# Patient Record
Sex: Female | Born: 1955 | Race: White | Hispanic: No | Smoking: Current every day smoker
Health system: Southern US, Community
[De-identification: ages and names within clinical notes are randomized; demographics above are authoritative.]

---
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
## 1988-08-11 HISTORY — PX: PARTIAL HYSTERECTOMY: SHX80

## 1988-08-11 HISTORY — PX: ELBOW SURGERY: SHX618

## 2013-08-17 DIAGNOSIS — M25476 Effusion, unspecified foot: Secondary | ICD-10-CM | POA: Diagnosis not present

## 2013-08-17 DIAGNOSIS — M25473 Effusion, unspecified ankle: Secondary | ICD-10-CM | POA: Diagnosis not present

## 2013-08-18 DIAGNOSIS — M25476 Effusion, unspecified foot: Secondary | ICD-10-CM | POA: Diagnosis not present

## 2013-08-18 DIAGNOSIS — M25473 Effusion, unspecified ankle: Secondary | ICD-10-CM | POA: Diagnosis not present

## 2013-08-23 DIAGNOSIS — M25476 Effusion, unspecified foot: Secondary | ICD-10-CM | POA: Diagnosis not present

## 2013-08-23 DIAGNOSIS — M25473 Effusion, unspecified ankle: Secondary | ICD-10-CM | POA: Diagnosis not present

## 2013-08-25 DIAGNOSIS — M25473 Effusion, unspecified ankle: Secondary | ICD-10-CM | POA: Diagnosis not present

## 2013-08-25 DIAGNOSIS — M25476 Effusion, unspecified foot: Secondary | ICD-10-CM | POA: Diagnosis not present

## 2013-08-25 DIAGNOSIS — M25579 Pain in unspecified ankle and joints of unspecified foot: Secondary | ICD-10-CM | POA: Diagnosis not present

## 2013-09-06 DIAGNOSIS — M25473 Effusion, unspecified ankle: Secondary | ICD-10-CM | POA: Diagnosis not present

## 2013-09-08 DIAGNOSIS — M25476 Effusion, unspecified foot: Secondary | ICD-10-CM | POA: Diagnosis not present

## 2013-09-08 DIAGNOSIS — M25473 Effusion, unspecified ankle: Secondary | ICD-10-CM | POA: Diagnosis not present

## 2013-09-14 DIAGNOSIS — M25473 Effusion, unspecified ankle: Secondary | ICD-10-CM | POA: Diagnosis not present

## 2013-09-14 DIAGNOSIS — M25476 Effusion, unspecified foot: Secondary | ICD-10-CM | POA: Diagnosis not present

## 2013-10-26 DIAGNOSIS — J441 Chronic obstructive pulmonary disease with (acute) exacerbation: Secondary | ICD-10-CM | POA: Diagnosis not present

## 2013-10-26 DIAGNOSIS — J449 Chronic obstructive pulmonary disease, unspecified: Secondary | ICD-10-CM | POA: Diagnosis not present

## 2013-10-26 DIAGNOSIS — I10 Essential (primary) hypertension: Secondary | ICD-10-CM | POA: Diagnosis not present

## 2013-10-26 DIAGNOSIS — R7301 Impaired fasting glucose: Secondary | ICD-10-CM | POA: Diagnosis not present

## 2013-10-26 DIAGNOSIS — E785 Hyperlipidemia, unspecified: Secondary | ICD-10-CM | POA: Diagnosis not present

## 2013-11-16 DIAGNOSIS — F320 Major depressive disorder, single episode, mild: Secondary | ICD-10-CM | POA: Diagnosis not present

## 2013-11-30 DIAGNOSIS — T6391XA Toxic effect of contact with unspecified venomous animal, accidental (unintentional), initial encounter: Secondary | ICD-10-CM | POA: Diagnosis not present

## 2013-11-30 DIAGNOSIS — K219 Gastro-esophageal reflux disease without esophagitis: Secondary | ICD-10-CM | POA: Diagnosis not present

## 2013-11-30 DIAGNOSIS — S90569A Insect bite (nonvenomous), unspecified ankle, initial encounter: Secondary | ICD-10-CM | POA: Diagnosis not present

## 2013-11-30 DIAGNOSIS — W57XXXA Bitten or stung by nonvenomous insect and other nonvenomous arthropods, initial encounter: Secondary | ICD-10-CM | POA: Diagnosis not present

## 2013-11-30 DIAGNOSIS — IMO0001 Reserved for inherently not codable concepts without codable children: Secondary | ICD-10-CM | POA: Diagnosis not present

## 2013-11-30 DIAGNOSIS — I10 Essential (primary) hypertension: Secondary | ICD-10-CM | POA: Diagnosis not present

## 2014-02-08 DIAGNOSIS — F339 Major depressive disorder, recurrent, unspecified: Secondary | ICD-10-CM | POA: Diagnosis not present

## 2014-02-22 DIAGNOSIS — M542 Cervicalgia: Secondary | ICD-10-CM | POA: Diagnosis not present

## 2014-02-22 DIAGNOSIS — J449 Chronic obstructive pulmonary disease, unspecified: Secondary | ICD-10-CM | POA: Diagnosis not present

## 2014-02-22 DIAGNOSIS — A672 Late lesions of pinta: Secondary | ICD-10-CM | POA: Diagnosis not present

## 2014-02-22 DIAGNOSIS — I10 Essential (primary) hypertension: Secondary | ICD-10-CM | POA: Diagnosis not present

## 2014-02-22 DIAGNOSIS — E119 Type 2 diabetes mellitus without complications: Secondary | ICD-10-CM | POA: Diagnosis not present

## 2014-02-22 DIAGNOSIS — E785 Hyperlipidemia, unspecified: Secondary | ICD-10-CM | POA: Diagnosis not present

## 2014-02-27 DIAGNOSIS — M5030 Other cervical disc degeneration, unspecified cervical region: Secondary | ICD-10-CM | POA: Diagnosis not present

## 2014-02-27 DIAGNOSIS — J449 Chronic obstructive pulmonary disease, unspecified: Secondary | ICD-10-CM | POA: Diagnosis not present

## 2014-03-14 DIAGNOSIS — L820 Inflamed seborrheic keratosis: Secondary | ICD-10-CM | POA: Diagnosis not present

## 2014-05-24 DIAGNOSIS — J449 Chronic obstructive pulmonary disease, unspecified: Secondary | ICD-10-CM | POA: Diagnosis not present

## 2014-05-24 DIAGNOSIS — E785 Hyperlipidemia, unspecified: Secondary | ICD-10-CM | POA: Diagnosis not present

## 2014-05-24 DIAGNOSIS — J069 Acute upper respiratory infection, unspecified: Secondary | ICD-10-CM | POA: Diagnosis not present

## 2014-05-24 DIAGNOSIS — I10 Essential (primary) hypertension: Secondary | ICD-10-CM | POA: Diagnosis not present

## 2014-05-24 DIAGNOSIS — E119 Type 2 diabetes mellitus without complications: Secondary | ICD-10-CM | POA: Diagnosis not present

## 2014-06-02 DIAGNOSIS — F329 Major depressive disorder, single episode, unspecified: Secondary | ICD-10-CM | POA: Diagnosis not present

## 2014-08-18 DIAGNOSIS — Z0000 Encounter for general adult medical examination without abnormal findings: Secondary | ICD-10-CM | POA: Diagnosis not present

## 2014-08-18 DIAGNOSIS — R801 Persistent proteinuria, unspecified: Secondary | ICD-10-CM | POA: Diagnosis not present

## 2014-08-18 DIAGNOSIS — E119 Type 2 diabetes mellitus without complications: Secondary | ICD-10-CM | POA: Diagnosis not present

## 2014-08-18 DIAGNOSIS — I10 Essential (primary) hypertension: Secondary | ICD-10-CM | POA: Diagnosis not present

## 2014-08-18 DIAGNOSIS — E785 Hyperlipidemia, unspecified: Secondary | ICD-10-CM | POA: Diagnosis not present

## 2014-08-25 DIAGNOSIS — F331 Major depressive disorder, recurrent, moderate: Secondary | ICD-10-CM | POA: Diagnosis not present

## 2014-08-25 DIAGNOSIS — Z1231 Encounter for screening mammogram for malignant neoplasm of breast: Secondary | ICD-10-CM | POA: Diagnosis not present

## 2014-09-17 DIAGNOSIS — I10 Essential (primary) hypertension: Secondary | ICD-10-CM | POA: Diagnosis not present

## 2014-09-17 DIAGNOSIS — M25551 Pain in right hip: Secondary | ICD-10-CM | POA: Diagnosis not present

## 2014-09-17 DIAGNOSIS — E780 Pure hypercholesterolemia: Secondary | ICD-10-CM | POA: Diagnosis not present

## 2014-09-27 DIAGNOSIS — I10 Essential (primary) hypertension: Secondary | ICD-10-CM | POA: Diagnosis not present

## 2014-09-27 DIAGNOSIS — J309 Allergic rhinitis, unspecified: Secondary | ICD-10-CM | POA: Diagnosis not present

## 2014-09-27 DIAGNOSIS — M25551 Pain in right hip: Secondary | ICD-10-CM | POA: Diagnosis not present

## 2014-09-27 DIAGNOSIS — J449 Chronic obstructive pulmonary disease, unspecified: Secondary | ICD-10-CM | POA: Diagnosis not present

## 2014-10-04 DIAGNOSIS — L820 Inflamed seborrheic keratosis: Secondary | ICD-10-CM | POA: Diagnosis not present

## 2014-10-12 DIAGNOSIS — N390 Urinary tract infection, site not specified: Secondary | ICD-10-CM | POA: Diagnosis not present

## 2014-10-12 DIAGNOSIS — R32 Unspecified urinary incontinence: Secondary | ICD-10-CM | POA: Diagnosis not present

## 2014-10-23 DIAGNOSIS — J449 Chronic obstructive pulmonary disease, unspecified: Secondary | ICD-10-CM | POA: Diagnosis not present

## 2014-10-23 DIAGNOSIS — R079 Chest pain, unspecified: Secondary | ICD-10-CM | POA: Diagnosis not present

## 2014-10-26 DIAGNOSIS — R000 Tachycardia, unspecified: Secondary | ICD-10-CM | POA: Diagnosis not present

## 2014-10-26 DIAGNOSIS — I249 Acute ischemic heart disease, unspecified: Secondary | ICD-10-CM | POA: Diagnosis not present

## 2014-10-26 DIAGNOSIS — Z881 Allergy status to other antibiotic agents status: Secondary | ICD-10-CM | POA: Diagnosis not present

## 2014-10-26 DIAGNOSIS — M1990 Unspecified osteoarthritis, unspecified site: Secondary | ICD-10-CM | POA: Diagnosis not present

## 2014-10-26 DIAGNOSIS — Z8489 Family history of other specified conditions: Secondary | ICD-10-CM | POA: Diagnosis not present

## 2014-10-26 DIAGNOSIS — K219 Gastro-esophageal reflux disease without esophagitis: Secondary | ICD-10-CM | POA: Diagnosis not present

## 2014-10-26 DIAGNOSIS — J449 Chronic obstructive pulmonary disease, unspecified: Secondary | ICD-10-CM | POA: Diagnosis not present

## 2014-10-26 DIAGNOSIS — R0789 Other chest pain: Secondary | ICD-10-CM | POA: Diagnosis not present

## 2014-10-26 DIAGNOSIS — E780 Pure hypercholesterolemia: Secondary | ICD-10-CM | POA: Diagnosis not present

## 2014-10-26 DIAGNOSIS — Z9849 Cataract extraction status, unspecified eye: Secondary | ICD-10-CM | POA: Diagnosis not present

## 2014-10-26 DIAGNOSIS — J45909 Unspecified asthma, uncomplicated: Secondary | ICD-10-CM | POA: Diagnosis not present

## 2014-10-26 DIAGNOSIS — Z90710 Acquired absence of both cervix and uterus: Secondary | ICD-10-CM | POA: Diagnosis not present

## 2014-10-26 DIAGNOSIS — Z87440 Personal history of urinary (tract) infections: Secondary | ICD-10-CM | POA: Diagnosis not present

## 2014-10-26 DIAGNOSIS — E784 Other hyperlipidemia: Secondary | ICD-10-CM | POA: Diagnosis not present

## 2014-10-26 DIAGNOSIS — Z888 Allergy status to other drugs, medicaments and biological substances status: Secondary | ICD-10-CM | POA: Diagnosis not present

## 2014-10-26 DIAGNOSIS — R072 Precordial pain: Secondary | ICD-10-CM | POA: Diagnosis not present

## 2014-10-26 DIAGNOSIS — Z8249 Family history of ischemic heart disease and other diseases of the circulatory system: Secondary | ICD-10-CM | POA: Diagnosis not present

## 2014-10-26 DIAGNOSIS — Z882 Allergy status to sulfonamides status: Secondary | ICD-10-CM | POA: Diagnosis not present

## 2014-10-26 DIAGNOSIS — I10 Essential (primary) hypertension: Secondary | ICD-10-CM | POA: Diagnosis not present

## 2014-10-26 DIAGNOSIS — F418 Other specified anxiety disorders: Secondary | ICD-10-CM | POA: Diagnosis not present

## 2014-10-26 DIAGNOSIS — R079 Chest pain, unspecified: Secondary | ICD-10-CM | POA: Diagnosis not present

## 2014-10-26 DIAGNOSIS — R0602 Shortness of breath: Secondary | ICD-10-CM | POA: Diagnosis not present

## 2014-10-26 DIAGNOSIS — Z9049 Acquired absence of other specified parts of digestive tract: Secondary | ICD-10-CM | POA: Diagnosis not present

## 2014-10-26 DIAGNOSIS — Z9889 Other specified postprocedural states: Secondary | ICD-10-CM | POA: Diagnosis not present

## 2014-10-26 DIAGNOSIS — Z886 Allergy status to analgesic agent status: Secondary | ICD-10-CM | POA: Diagnosis not present

## 2014-10-26 DIAGNOSIS — Z9981 Dependence on supplemental oxygen: Secondary | ICD-10-CM | POA: Diagnosis not present

## 2014-10-26 DIAGNOSIS — F17210 Nicotine dependence, cigarettes, uncomplicated: Secondary | ICD-10-CM | POA: Diagnosis not present

## 2014-10-26 DIAGNOSIS — Z720 Tobacco use: Secondary | ICD-10-CM | POA: Diagnosis not present

## 2014-10-26 DIAGNOSIS — E785 Hyperlipidemia, unspecified: Secondary | ICD-10-CM | POA: Diagnosis not present

## 2014-10-26 DIAGNOSIS — R0682 Tachypnea, not elsewhere classified: Secondary | ICD-10-CM | POA: Diagnosis not present

## 2014-10-27 DIAGNOSIS — K219 Gastro-esophageal reflux disease without esophagitis: Secondary | ICD-10-CM | POA: Diagnosis not present

## 2014-10-27 DIAGNOSIS — E784 Other hyperlipidemia: Secondary | ICD-10-CM | POA: Diagnosis not present

## 2014-10-27 DIAGNOSIS — R079 Chest pain, unspecified: Secondary | ICD-10-CM | POA: Diagnosis not present

## 2014-10-27 DIAGNOSIS — R072 Precordial pain: Secondary | ICD-10-CM | POA: Diagnosis not present

## 2014-10-27 DIAGNOSIS — I10 Essential (primary) hypertension: Secondary | ICD-10-CM | POA: Diagnosis not present

## 2014-11-07 DIAGNOSIS — E785 Hyperlipidemia, unspecified: Secondary | ICD-10-CM | POA: Diagnosis not present

## 2014-11-07 DIAGNOSIS — J449 Chronic obstructive pulmonary disease, unspecified: Secondary | ICD-10-CM | POA: Diagnosis not present

## 2014-11-07 DIAGNOSIS — F29 Unspecified psychosis not due to a substance or known physiological condition: Secondary | ICD-10-CM | POA: Diagnosis not present

## 2014-11-07 DIAGNOSIS — F411 Generalized anxiety disorder: Secondary | ICD-10-CM | POA: Diagnosis not present

## 2014-11-07 DIAGNOSIS — I10 Essential (primary) hypertension: Secondary | ICD-10-CM | POA: Diagnosis not present

## 2014-11-07 DIAGNOSIS — Z6834 Body mass index (BMI) 34.0-34.9, adult: Secondary | ICD-10-CM | POA: Diagnosis not present

## 2014-11-07 DIAGNOSIS — K219 Gastro-esophageal reflux disease without esophagitis: Secondary | ICD-10-CM | POA: Diagnosis not present

## 2014-11-17 DIAGNOSIS — F331 Major depressive disorder, recurrent, moderate: Secondary | ICD-10-CM | POA: Diagnosis not present

## 2014-12-08 DIAGNOSIS — Z79899 Other long term (current) drug therapy: Secondary | ICD-10-CM | POA: Diagnosis not present

## 2014-12-08 DIAGNOSIS — J449 Chronic obstructive pulmonary disease, unspecified: Secondary | ICD-10-CM | POA: Diagnosis not present

## 2014-12-08 DIAGNOSIS — E785 Hyperlipidemia, unspecified: Secondary | ICD-10-CM | POA: Diagnosis not present

## 2014-12-08 DIAGNOSIS — I10 Essential (primary) hypertension: Secondary | ICD-10-CM | POA: Diagnosis not present

## 2014-12-08 DIAGNOSIS — F411 Generalized anxiety disorder: Secondary | ICD-10-CM | POA: Diagnosis not present

## 2014-12-08 DIAGNOSIS — Z6835 Body mass index (BMI) 35.0-35.9, adult: Secondary | ICD-10-CM | POA: Diagnosis not present

## 2014-12-08 DIAGNOSIS — K219 Gastro-esophageal reflux disease without esophagitis: Secondary | ICD-10-CM | POA: Diagnosis not present

## 2015-01-24 DIAGNOSIS — F331 Major depressive disorder, recurrent, moderate: Secondary | ICD-10-CM | POA: Diagnosis not present

## 2015-04-10 DIAGNOSIS — Z79899 Other long term (current) drug therapy: Secondary | ICD-10-CM | POA: Diagnosis not present

## 2015-04-10 DIAGNOSIS — J449 Chronic obstructive pulmonary disease, unspecified: Secondary | ICD-10-CM | POA: Diagnosis not present

## 2015-04-10 DIAGNOSIS — E785 Hyperlipidemia, unspecified: Secondary | ICD-10-CM | POA: Diagnosis not present

## 2015-04-10 DIAGNOSIS — Z6835 Body mass index (BMI) 35.0-35.9, adult: Secondary | ICD-10-CM | POA: Diagnosis not present

## 2015-04-10 DIAGNOSIS — I10 Essential (primary) hypertension: Secondary | ICD-10-CM | POA: Diagnosis not present

## 2015-04-10 DIAGNOSIS — F411 Generalized anxiety disorder: Secondary | ICD-10-CM | POA: Diagnosis not present

## 2015-04-10 DIAGNOSIS — K219 Gastro-esophageal reflux disease without esophagitis: Secondary | ICD-10-CM | POA: Diagnosis not present

## 2015-04-10 DIAGNOSIS — K5900 Constipation, unspecified: Secondary | ICD-10-CM | POA: Diagnosis not present

## 2015-05-18 DIAGNOSIS — F331 Major depressive disorder, recurrent, moderate: Secondary | ICD-10-CM | POA: Diagnosis not present

## 2015-08-10 DIAGNOSIS — Z6834 Body mass index (BMI) 34.0-34.9, adult: Secondary | ICD-10-CM | POA: Diagnosis not present

## 2015-08-10 DIAGNOSIS — K219 Gastro-esophageal reflux disease without esophagitis: Secondary | ICD-10-CM | POA: Diagnosis not present

## 2015-08-10 DIAGNOSIS — Z79899 Other long term (current) drug therapy: Secondary | ICD-10-CM | POA: Diagnosis not present

## 2015-08-10 DIAGNOSIS — I10 Essential (primary) hypertension: Secondary | ICD-10-CM | POA: Diagnosis not present

## 2015-08-10 DIAGNOSIS — Z1231 Encounter for screening mammogram for malignant neoplasm of breast: Secondary | ICD-10-CM | POA: Diagnosis not present

## 2015-08-10 DIAGNOSIS — J449 Chronic obstructive pulmonary disease, unspecified: Secondary | ICD-10-CM | POA: Diagnosis not present

## 2015-08-10 DIAGNOSIS — E785 Hyperlipidemia, unspecified: Secondary | ICD-10-CM | POA: Diagnosis not present

## 2015-08-10 DIAGNOSIS — Z720 Tobacco use: Secondary | ICD-10-CM | POA: Diagnosis not present

## 2015-08-27 DIAGNOSIS — Z1231 Encounter for screening mammogram for malignant neoplasm of breast: Secondary | ICD-10-CM | POA: Diagnosis not present

## 2015-08-30 DIAGNOSIS — F331 Major depressive disorder, recurrent, moderate: Secondary | ICD-10-CM | POA: Diagnosis not present

## 2015-09-03 DIAGNOSIS — Z0000 Encounter for general adult medical examination without abnormal findings: Secondary | ICD-10-CM | POA: Diagnosis not present

## 2015-09-03 DIAGNOSIS — E1165 Type 2 diabetes mellitus with hyperglycemia: Secondary | ICD-10-CM | POA: Diagnosis not present

## 2015-09-03 DIAGNOSIS — Z6834 Body mass index (BMI) 34.0-34.9, adult: Secondary | ICD-10-CM | POA: Diagnosis not present

## 2015-09-03 DIAGNOSIS — Z23 Encounter for immunization: Secondary | ICD-10-CM | POA: Diagnosis not present

## 2015-09-03 DIAGNOSIS — J449 Chronic obstructive pulmonary disease, unspecified: Secondary | ICD-10-CM | POA: Diagnosis not present

## 2015-09-03 DIAGNOSIS — E669 Obesity, unspecified: Secondary | ICD-10-CM | POA: Diagnosis not present

## 2015-10-02 DIAGNOSIS — Z79899 Other long term (current) drug therapy: Secondary | ICD-10-CM | POA: Diagnosis not present

## 2015-10-02 DIAGNOSIS — Z6834 Body mass index (BMI) 34.0-34.9, adult: Secondary | ICD-10-CM | POA: Diagnosis not present

## 2015-10-02 DIAGNOSIS — J309 Allergic rhinitis, unspecified: Secondary | ICD-10-CM | POA: Diagnosis not present

## 2015-10-11 DIAGNOSIS — F331 Major depressive disorder, recurrent, moderate: Secondary | ICD-10-CM | POA: Diagnosis not present

## 2015-10-25 DIAGNOSIS — J069 Acute upper respiratory infection, unspecified: Secondary | ICD-10-CM | POA: Diagnosis not present

## 2015-10-25 DIAGNOSIS — K649 Unspecified hemorrhoids: Secondary | ICD-10-CM | POA: Diagnosis not present

## 2015-10-25 DIAGNOSIS — R6889 Other general symptoms and signs: Secondary | ICD-10-CM | POA: Diagnosis not present

## 2015-10-25 DIAGNOSIS — J449 Chronic obstructive pulmonary disease, unspecified: Secondary | ICD-10-CM | POA: Diagnosis not present

## 2015-10-25 DIAGNOSIS — Z6834 Body mass index (BMI) 34.0-34.9, adult: Secondary | ICD-10-CM | POA: Diagnosis not present

## 2015-11-15 DIAGNOSIS — F331 Major depressive disorder, recurrent, moderate: Secondary | ICD-10-CM | POA: Diagnosis not present

## 2015-11-20 DIAGNOSIS — S99922A Unspecified injury of left foot, initial encounter: Secondary | ICD-10-CM | POA: Diagnosis not present

## 2015-11-20 DIAGNOSIS — S93602A Unspecified sprain of left foot, initial encounter: Secondary | ICD-10-CM | POA: Diagnosis not present

## 2015-11-20 DIAGNOSIS — S99912A Unspecified injury of left ankle, initial encounter: Secondary | ICD-10-CM | POA: Diagnosis not present

## 2015-11-20 DIAGNOSIS — M79672 Pain in left foot: Secondary | ICD-10-CM | POA: Diagnosis not present

## 2015-11-20 DIAGNOSIS — M25572 Pain in left ankle and joints of left foot: Secondary | ICD-10-CM | POA: Diagnosis not present

## 2015-11-29 DIAGNOSIS — S99912A Unspecified injury of left ankle, initial encounter: Secondary | ICD-10-CM | POA: Diagnosis not present

## 2015-12-07 DIAGNOSIS — E1165 Type 2 diabetes mellitus with hyperglycemia: Secondary | ICD-10-CM | POA: Diagnosis not present

## 2015-12-07 DIAGNOSIS — E785 Hyperlipidemia, unspecified: Secondary | ICD-10-CM | POA: Diagnosis not present

## 2015-12-07 DIAGNOSIS — J449 Chronic obstructive pulmonary disease, unspecified: Secondary | ICD-10-CM | POA: Diagnosis not present

## 2015-12-07 DIAGNOSIS — I10 Essential (primary) hypertension: Secondary | ICD-10-CM | POA: Diagnosis not present

## 2015-12-07 DIAGNOSIS — Z79899 Other long term (current) drug therapy: Secondary | ICD-10-CM | POA: Diagnosis not present

## 2015-12-07 DIAGNOSIS — Z6834 Body mass index (BMI) 34.0-34.9, adult: Secondary | ICD-10-CM | POA: Diagnosis not present

## 2015-12-07 DIAGNOSIS — M545 Low back pain: Secondary | ICD-10-CM | POA: Diagnosis not present

## 2015-12-07 DIAGNOSIS — Z1389 Encounter for screening for other disorder: Secondary | ICD-10-CM | POA: Diagnosis not present

## 2016-01-31 DIAGNOSIS — F331 Major depressive disorder, recurrent, moderate: Secondary | ICD-10-CM | POA: Diagnosis not present

## 2016-03-11 DIAGNOSIS — I10 Essential (primary) hypertension: Secondary | ICD-10-CM | POA: Diagnosis not present

## 2016-03-11 DIAGNOSIS — J449 Chronic obstructive pulmonary disease, unspecified: Secondary | ICD-10-CM | POA: Diagnosis not present

## 2016-03-11 DIAGNOSIS — Z79899 Other long term (current) drug therapy: Secondary | ICD-10-CM | POA: Diagnosis not present

## 2016-03-11 DIAGNOSIS — E1165 Type 2 diabetes mellitus with hyperglycemia: Secondary | ICD-10-CM | POA: Diagnosis not present

## 2016-03-11 DIAGNOSIS — E785 Hyperlipidemia, unspecified: Secondary | ICD-10-CM | POA: Diagnosis not present

## 2016-03-11 DIAGNOSIS — Z1389 Encounter for screening for other disorder: Secondary | ICD-10-CM | POA: Diagnosis not present

## 2016-03-11 DIAGNOSIS — Z6834 Body mass index (BMI) 34.0-34.9, adult: Secondary | ICD-10-CM | POA: Diagnosis not present

## 2016-03-27 DIAGNOSIS — F331 Major depressive disorder, recurrent, moderate: Secondary | ICD-10-CM | POA: Diagnosis not present

## 2016-04-23 DIAGNOSIS — J342 Deviated nasal septum: Secondary | ICD-10-CM | POA: Diagnosis not present

## 2016-04-23 DIAGNOSIS — J3489 Other specified disorders of nose and nasal sinuses: Secondary | ICD-10-CM | POA: Diagnosis not present

## 2016-04-23 DIAGNOSIS — J343 Hypertrophy of nasal turbinates: Secondary | ICD-10-CM | POA: Diagnosis not present

## 2016-04-23 DIAGNOSIS — J449 Chronic obstructive pulmonary disease, unspecified: Secondary | ICD-10-CM | POA: Diagnosis not present

## 2016-05-12 DIAGNOSIS — E669 Obesity, unspecified: Secondary | ICD-10-CM | POA: Diagnosis not present

## 2016-05-12 DIAGNOSIS — Z79899 Other long term (current) drug therapy: Secondary | ICD-10-CM | POA: Diagnosis not present

## 2016-05-12 DIAGNOSIS — Z720 Tobacco use: Secondary | ICD-10-CM | POA: Diagnosis not present

## 2016-05-12 DIAGNOSIS — J441 Chronic obstructive pulmonary disease with (acute) exacerbation: Secondary | ICD-10-CM | POA: Diagnosis not present

## 2016-05-12 DIAGNOSIS — J410 Simple chronic bronchitis: Secondary | ICD-10-CM | POA: Diagnosis not present

## 2016-05-12 DIAGNOSIS — Z6835 Body mass index (BMI) 35.0-35.9, adult: Secondary | ICD-10-CM | POA: Diagnosis not present

## 2016-05-12 DIAGNOSIS — F17200 Nicotine dependence, unspecified, uncomplicated: Secondary | ICD-10-CM | POA: Diagnosis not present

## 2016-06-03 DIAGNOSIS — Z87891 Personal history of nicotine dependence: Secondary | ICD-10-CM | POA: Diagnosis not present

## 2016-06-03 DIAGNOSIS — J449 Chronic obstructive pulmonary disease, unspecified: Secondary | ICD-10-CM | POA: Diagnosis not present

## 2016-06-03 DIAGNOSIS — R0602 Shortness of breath: Secondary | ICD-10-CM | POA: Diagnosis not present

## 2016-06-03 DIAGNOSIS — Z13820 Encounter for screening for osteoporosis: Secondary | ICD-10-CM | POA: Diagnosis not present

## 2016-06-03 DIAGNOSIS — Z7951 Long term (current) use of inhaled steroids: Secondary | ICD-10-CM | POA: Diagnosis not present

## 2016-06-05 DIAGNOSIS — Z122 Encounter for screening for malignant neoplasm of respiratory organs: Secondary | ICD-10-CM | POA: Diagnosis not present

## 2016-06-05 DIAGNOSIS — Z87891 Personal history of nicotine dependence: Secondary | ICD-10-CM | POA: Diagnosis not present

## 2016-06-12 DIAGNOSIS — F17200 Nicotine dependence, unspecified, uncomplicated: Secondary | ICD-10-CM | POA: Diagnosis not present

## 2016-06-12 DIAGNOSIS — Z87891 Personal history of nicotine dependence: Secondary | ICD-10-CM | POA: Diagnosis not present

## 2016-06-12 DIAGNOSIS — J9801 Acute bronchospasm: Secondary | ICD-10-CM | POA: Diagnosis not present

## 2016-06-12 DIAGNOSIS — Z23 Encounter for immunization: Secondary | ICD-10-CM | POA: Diagnosis not present

## 2016-06-12 DIAGNOSIS — J449 Chronic obstructive pulmonary disease, unspecified: Secondary | ICD-10-CM | POA: Diagnosis not present

## 2016-06-25 DIAGNOSIS — F4310 Post-traumatic stress disorder, unspecified: Secondary | ICD-10-CM | POA: Diagnosis not present

## 2016-07-01 DIAGNOSIS — J449 Chronic obstructive pulmonary disease, unspecified: Secondary | ICD-10-CM | POA: Diagnosis not present

## 2016-07-14 DIAGNOSIS — Z79899 Other long term (current) drug therapy: Secondary | ICD-10-CM | POA: Diagnosis not present

## 2016-07-14 DIAGNOSIS — Z6836 Body mass index (BMI) 36.0-36.9, adult: Secondary | ICD-10-CM | POA: Diagnosis not present

## 2016-07-14 DIAGNOSIS — I10 Essential (primary) hypertension: Secondary | ICD-10-CM | POA: Diagnosis not present

## 2016-07-14 DIAGNOSIS — E785 Hyperlipidemia, unspecified: Secondary | ICD-10-CM | POA: Diagnosis not present

## 2016-07-14 DIAGNOSIS — J441 Chronic obstructive pulmonary disease with (acute) exacerbation: Secondary | ICD-10-CM | POA: Diagnosis not present

## 2016-07-14 DIAGNOSIS — E1165 Type 2 diabetes mellitus with hyperglycemia: Secondary | ICD-10-CM | POA: Diagnosis not present

## 2016-07-28 DIAGNOSIS — D72829 Elevated white blood cell count, unspecified: Secondary | ICD-10-CM | POA: Diagnosis not present

## 2016-09-01 DIAGNOSIS — K648 Other hemorrhoids: Secondary | ICD-10-CM | POA: Diagnosis not present

## 2016-09-01 DIAGNOSIS — Z6838 Body mass index (BMI) 38.0-38.9, adult: Secondary | ICD-10-CM | POA: Diagnosis not present

## 2016-10-14 DIAGNOSIS — Z1231 Encounter for screening mammogram for malignant neoplasm of breast: Secondary | ICD-10-CM | POA: Diagnosis not present

## 2016-10-15 DIAGNOSIS — F4310 Post-traumatic stress disorder, unspecified: Secondary | ICD-10-CM

## 2016-10-15 DIAGNOSIS — F331 Major depressive disorder, recurrent, moderate: Secondary | ICD-10-CM | POA: Diagnosis not present

## 2016-10-15 HISTORY — DX: Post-traumatic stress disorder, unspecified: F43.10

## 2016-11-12 DIAGNOSIS — E785 Hyperlipidemia, unspecified: Secondary | ICD-10-CM | POA: Diagnosis not present

## 2016-11-12 DIAGNOSIS — J441 Chronic obstructive pulmonary disease with (acute) exacerbation: Secondary | ICD-10-CM | POA: Diagnosis not present

## 2016-11-12 DIAGNOSIS — Z6838 Body mass index (BMI) 38.0-38.9, adult: Secondary | ICD-10-CM | POA: Diagnosis not present

## 2016-11-12 DIAGNOSIS — Z79899 Other long term (current) drug therapy: Secondary | ICD-10-CM | POA: Diagnosis not present

## 2016-11-12 DIAGNOSIS — I10 Essential (primary) hypertension: Secondary | ICD-10-CM | POA: Diagnosis not present

## 2016-11-12 DIAGNOSIS — E669 Obesity, unspecified: Secondary | ICD-10-CM | POA: Diagnosis not present

## 2016-11-12 DIAGNOSIS — E1165 Type 2 diabetes mellitus with hyperglycemia: Secondary | ICD-10-CM | POA: Diagnosis not present

## 2016-11-13 DIAGNOSIS — M25512 Pain in left shoulder: Secondary | ICD-10-CM | POA: Diagnosis not present

## 2016-11-13 DIAGNOSIS — S79912A Unspecified injury of left hip, initial encounter: Secondary | ICD-10-CM | POA: Diagnosis not present

## 2016-11-13 DIAGNOSIS — S7002XA Contusion of left hip, initial encounter: Secondary | ICD-10-CM | POA: Diagnosis not present

## 2016-11-13 DIAGNOSIS — S50812A Abrasion of left forearm, initial encounter: Secondary | ICD-10-CM | POA: Diagnosis not present

## 2016-11-13 DIAGNOSIS — S46912A Strain of unspecified muscle, fascia and tendon at shoulder and upper arm level, left arm, initial encounter: Secondary | ICD-10-CM | POA: Diagnosis not present

## 2016-11-13 DIAGNOSIS — S299XXA Unspecified injury of thorax, initial encounter: Secondary | ICD-10-CM | POA: Diagnosis not present

## 2016-11-21 DIAGNOSIS — J441 Chronic obstructive pulmonary disease with (acute) exacerbation: Secondary | ICD-10-CM | POA: Diagnosis not present

## 2016-11-21 DIAGNOSIS — Z6838 Body mass index (BMI) 38.0-38.9, adult: Secondary | ICD-10-CM | POA: Diagnosis not present

## 2016-11-21 DIAGNOSIS — E669 Obesity, unspecified: Secondary | ICD-10-CM | POA: Diagnosis not present

## 2016-11-21 DIAGNOSIS — S40012A Contusion of left shoulder, initial encounter: Secondary | ICD-10-CM | POA: Diagnosis not present

## 2016-12-01 DIAGNOSIS — S4992XA Unspecified injury of left shoulder and upper arm, initial encounter: Secondary | ICD-10-CM | POA: Diagnosis not present

## 2016-12-01 DIAGNOSIS — M25512 Pain in left shoulder: Secondary | ICD-10-CM | POA: Diagnosis not present

## 2016-12-04 DIAGNOSIS — M25512 Pain in left shoulder: Secondary | ICD-10-CM | POA: Diagnosis not present

## 2016-12-04 DIAGNOSIS — X509XXA Other and unspecified overexertion or strenuous movements or postures, initial encounter: Secondary | ICD-10-CM | POA: Diagnosis not present

## 2016-12-04 DIAGNOSIS — M19012 Primary osteoarthritis, left shoulder: Secondary | ICD-10-CM | POA: Diagnosis not present

## 2016-12-04 DIAGNOSIS — S42252A Displaced fracture of greater tuberosity of left humerus, initial encounter for closed fracture: Secondary | ICD-10-CM | POA: Diagnosis not present

## 2016-12-05 DIAGNOSIS — N959 Unspecified menopausal and perimenopausal disorder: Secondary | ICD-10-CM | POA: Diagnosis not present

## 2016-12-05 DIAGNOSIS — Z1389 Encounter for screening for other disorder: Secondary | ICD-10-CM | POA: Diagnosis not present

## 2016-12-05 DIAGNOSIS — Z0000 Encounter for general adult medical examination without abnormal findings: Secondary | ICD-10-CM | POA: Diagnosis not present

## 2016-12-05 DIAGNOSIS — Z1211 Encounter for screening for malignant neoplasm of colon: Secondary | ICD-10-CM | POA: Diagnosis not present

## 2016-12-05 DIAGNOSIS — Z6837 Body mass index (BMI) 37.0-37.9, adult: Secondary | ICD-10-CM | POA: Diagnosis not present

## 2016-12-05 DIAGNOSIS — Z0100 Encounter for examination of eyes and vision without abnormal findings: Secondary | ICD-10-CM | POA: Diagnosis not present

## 2016-12-05 DIAGNOSIS — E785 Hyperlipidemia, unspecified: Secondary | ICD-10-CM | POA: Diagnosis not present

## 2016-12-05 DIAGNOSIS — Z9181 History of falling: Secondary | ICD-10-CM | POA: Diagnosis not present

## 2016-12-05 DIAGNOSIS — E1165 Type 2 diabetes mellitus with hyperglycemia: Secondary | ICD-10-CM | POA: Diagnosis not present

## 2016-12-08 DIAGNOSIS — M25512 Pain in left shoulder: Secondary | ICD-10-CM | POA: Diagnosis not present

## 2016-12-08 DIAGNOSIS — S42255D Nondisplaced fracture of greater tuberosity of left humerus, subsequent encounter for fracture with routine healing: Secondary | ICD-10-CM | POA: Diagnosis not present

## 2016-12-10 DIAGNOSIS — F331 Major depressive disorder, recurrent, moderate: Secondary | ICD-10-CM | POA: Diagnosis not present

## 2016-12-10 DIAGNOSIS — F4310 Post-traumatic stress disorder, unspecified: Secondary | ICD-10-CM | POA: Diagnosis not present

## 2016-12-11 DIAGNOSIS — M25512 Pain in left shoulder: Secondary | ICD-10-CM | POA: Diagnosis not present

## 2016-12-11 DIAGNOSIS — M6281 Muscle weakness (generalized): Secondary | ICD-10-CM | POA: Diagnosis not present

## 2016-12-16 DIAGNOSIS — M6281 Muscle weakness (generalized): Secondary | ICD-10-CM | POA: Diagnosis not present

## 2016-12-16 DIAGNOSIS — M25512 Pain in left shoulder: Secondary | ICD-10-CM | POA: Diagnosis not present

## 2016-12-19 DIAGNOSIS — M6281 Muscle weakness (generalized): Secondary | ICD-10-CM | POA: Diagnosis not present

## 2016-12-19 DIAGNOSIS — M25512 Pain in left shoulder: Secondary | ICD-10-CM | POA: Diagnosis not present

## 2016-12-22 DIAGNOSIS — Z1212 Encounter for screening for malignant neoplasm of rectum: Secondary | ICD-10-CM | POA: Diagnosis not present

## 2016-12-22 DIAGNOSIS — Z1211 Encounter for screening for malignant neoplasm of colon: Secondary | ICD-10-CM | POA: Diagnosis not present

## 2016-12-29 DIAGNOSIS — K922 Gastrointestinal hemorrhage, unspecified: Secondary | ICD-10-CM | POA: Diagnosis not present

## 2016-12-29 DIAGNOSIS — F17210 Nicotine dependence, cigarettes, uncomplicated: Secondary | ICD-10-CM | POA: Diagnosis not present

## 2016-12-29 DIAGNOSIS — J449 Chronic obstructive pulmonary disease, unspecified: Secondary | ICD-10-CM | POA: Diagnosis not present

## 2016-12-29 DIAGNOSIS — S42255D Nondisplaced fracture of greater tuberosity of left humerus, subsequent encounter for fracture with routine healing: Secondary | ICD-10-CM | POA: Diagnosis not present

## 2016-12-29 DIAGNOSIS — I10 Essential (primary) hypertension: Secondary | ICD-10-CM | POA: Diagnosis not present

## 2016-12-31 DIAGNOSIS — M25512 Pain in left shoulder: Secondary | ICD-10-CM | POA: Diagnosis not present

## 2016-12-31 DIAGNOSIS — M6281 Muscle weakness (generalized): Secondary | ICD-10-CM | POA: Diagnosis not present

## 2017-01-07 DIAGNOSIS — M6281 Muscle weakness (generalized): Secondary | ICD-10-CM | POA: Diagnosis not present

## 2017-01-07 DIAGNOSIS — M25512 Pain in left shoulder: Secondary | ICD-10-CM | POA: Diagnosis not present

## 2017-01-08 DIAGNOSIS — K921 Melena: Secondary | ICD-10-CM | POA: Diagnosis not present

## 2017-01-08 DIAGNOSIS — K219 Gastro-esophageal reflux disease without esophagitis: Secondary | ICD-10-CM | POA: Diagnosis not present

## 2017-01-08 DIAGNOSIS — R1310 Dysphagia, unspecified: Secondary | ICD-10-CM | POA: Diagnosis not present

## 2017-01-08 DIAGNOSIS — K649 Unspecified hemorrhoids: Secondary | ICD-10-CM | POA: Diagnosis not present

## 2017-01-15 DIAGNOSIS — M6281 Muscle weakness (generalized): Secondary | ICD-10-CM | POA: Diagnosis not present

## 2017-01-15 DIAGNOSIS — M25512 Pain in left shoulder: Secondary | ICD-10-CM | POA: Diagnosis not present

## 2017-01-20 DIAGNOSIS — K644 Residual hemorrhoidal skin tags: Secondary | ICD-10-CM | POA: Diagnosis not present

## 2017-01-20 DIAGNOSIS — D122 Benign neoplasm of ascending colon: Secondary | ICD-10-CM | POA: Diagnosis not present

## 2017-01-20 DIAGNOSIS — Z79899 Other long term (current) drug therapy: Secondary | ICD-10-CM | POA: Diagnosis not present

## 2017-01-20 DIAGNOSIS — D126 Benign neoplasm of colon, unspecified: Secondary | ICD-10-CM | POA: Diagnosis not present

## 2017-01-20 DIAGNOSIS — Z9049 Acquired absence of other specified parts of digestive tract: Secondary | ICD-10-CM | POA: Diagnosis not present

## 2017-01-20 DIAGNOSIS — I10 Essential (primary) hypertension: Secondary | ICD-10-CM | POA: Diagnosis not present

## 2017-01-20 DIAGNOSIS — K921 Melena: Secondary | ICD-10-CM | POA: Diagnosis not present

## 2017-01-20 DIAGNOSIS — D124 Benign neoplasm of descending colon: Secondary | ICD-10-CM | POA: Diagnosis not present

## 2017-01-20 DIAGNOSIS — Z86010 Personal history of colonic polyps: Secondary | ICD-10-CM | POA: Diagnosis not present

## 2017-01-20 DIAGNOSIS — F17200 Nicotine dependence, unspecified, uncomplicated: Secondary | ICD-10-CM | POA: Diagnosis not present

## 2017-01-20 DIAGNOSIS — K5730 Diverticulosis of large intestine without perforation or abscess without bleeding: Secondary | ICD-10-CM | POA: Diagnosis not present

## 2017-01-20 DIAGNOSIS — K648 Other hemorrhoids: Secondary | ICD-10-CM | POA: Diagnosis not present

## 2017-01-28 DIAGNOSIS — S42255D Nondisplaced fracture of greater tuberosity of left humerus, subsequent encounter for fracture with routine healing: Secondary | ICD-10-CM | POA: Diagnosis not present

## 2017-02-23 DIAGNOSIS — R0602 Shortness of breath: Secondary | ICD-10-CM | POA: Diagnosis not present

## 2017-02-23 DIAGNOSIS — F411 Generalized anxiety disorder: Secondary | ICD-10-CM | POA: Diagnosis not present

## 2017-02-23 DIAGNOSIS — J449 Chronic obstructive pulmonary disease, unspecified: Secondary | ICD-10-CM | POA: Diagnosis not present

## 2017-02-23 DIAGNOSIS — Z87891 Personal history of nicotine dependence: Secondary | ICD-10-CM | POA: Diagnosis not present

## 2017-02-23 DIAGNOSIS — F17200 Nicotine dependence, unspecified, uncomplicated: Secondary | ICD-10-CM | POA: Diagnosis not present

## 2017-02-25 DIAGNOSIS — F331 Major depressive disorder, recurrent, moderate: Secondary | ICD-10-CM | POA: Diagnosis not present

## 2017-02-25 DIAGNOSIS — F315 Bipolar disorder, current episode depressed, severe, with psychotic features: Secondary | ICD-10-CM | POA: Diagnosis not present

## 2017-03-16 DIAGNOSIS — I10 Essential (primary) hypertension: Secondary | ICD-10-CM | POA: Diagnosis not present

## 2017-03-16 DIAGNOSIS — Z79899 Other long term (current) drug therapy: Secondary | ICD-10-CM | POA: Diagnosis not present

## 2017-03-16 DIAGNOSIS — H6980 Other specified disorders of Eustachian tube, unspecified ear: Secondary | ICD-10-CM | POA: Diagnosis not present

## 2017-03-16 DIAGNOSIS — E1165 Type 2 diabetes mellitus with hyperglycemia: Secondary | ICD-10-CM | POA: Diagnosis not present

## 2017-03-16 DIAGNOSIS — E785 Hyperlipidemia, unspecified: Secondary | ICD-10-CM | POA: Diagnosis not present

## 2017-03-16 DIAGNOSIS — Z6837 Body mass index (BMI) 37.0-37.9, adult: Secondary | ICD-10-CM | POA: Diagnosis not present

## 2017-03-16 DIAGNOSIS — M545 Low back pain: Secondary | ICD-10-CM | POA: Diagnosis not present

## 2017-03-16 DIAGNOSIS — J441 Chronic obstructive pulmonary disease with (acute) exacerbation: Secondary | ICD-10-CM | POA: Diagnosis not present

## 2017-03-16 DIAGNOSIS — K219 Gastro-esophageal reflux disease without esophagitis: Secondary | ICD-10-CM | POA: Diagnosis not present

## 2017-03-16 DIAGNOSIS — E669 Obesity, unspecified: Secondary | ICD-10-CM | POA: Diagnosis not present

## 2017-03-25 DIAGNOSIS — J449 Chronic obstructive pulmonary disease, unspecified: Secondary | ICD-10-CM | POA: Diagnosis not present

## 2017-03-30 DIAGNOSIS — D509 Iron deficiency anemia, unspecified: Secondary | ICD-10-CM | POA: Diagnosis not present

## 2017-04-25 DIAGNOSIS — J449 Chronic obstructive pulmonary disease, unspecified: Secondary | ICD-10-CM | POA: Diagnosis not present

## 2017-04-29 DIAGNOSIS — F331 Major depressive disorder, recurrent, moderate: Secondary | ICD-10-CM | POA: Diagnosis not present

## 2017-05-05 DIAGNOSIS — K219 Gastro-esophageal reflux disease without esophagitis: Secondary | ICD-10-CM

## 2017-05-05 DIAGNOSIS — G8929 Other chronic pain: Secondary | ICD-10-CM | POA: Diagnosis not present

## 2017-05-05 DIAGNOSIS — D508 Other iron deficiency anemias: Secondary | ICD-10-CM | POA: Diagnosis not present

## 2017-05-05 DIAGNOSIS — I10 Essential (primary) hypertension: Secondary | ICD-10-CM | POA: Diagnosis not present

## 2017-05-05 DIAGNOSIS — M5441 Lumbago with sciatica, right side: Secondary | ICD-10-CM | POA: Diagnosis not present

## 2017-05-05 HISTORY — DX: Gastro-esophageal reflux disease without esophagitis: K21.9

## 2017-05-12 DIAGNOSIS — E118 Type 2 diabetes mellitus with unspecified complications: Secondary | ICD-10-CM | POA: Diagnosis not present

## 2017-05-12 DIAGNOSIS — B9620 Unspecified Escherichia coli [E. coli] as the cause of diseases classified elsewhere: Secondary | ICD-10-CM | POA: Diagnosis not present

## 2017-05-12 DIAGNOSIS — R300 Dysuria: Secondary | ICD-10-CM | POA: Diagnosis not present

## 2017-05-12 DIAGNOSIS — R69 Illness, unspecified: Secondary | ICD-10-CM | POA: Diagnosis not present

## 2017-05-12 DIAGNOSIS — N183 Chronic kidney disease, stage 3 (moderate): Secondary | ICD-10-CM | POA: Diagnosis not present

## 2017-05-12 DIAGNOSIS — I129 Hypertensive chronic kidney disease with stage 1 through stage 4 chronic kidney disease, or unspecified chronic kidney disease: Secondary | ICD-10-CM | POA: Diagnosis not present

## 2017-05-12 DIAGNOSIS — N3000 Acute cystitis without hematuria: Secondary | ICD-10-CM | POA: Diagnosis not present

## 2017-05-12 DIAGNOSIS — R062 Wheezing: Secondary | ICD-10-CM | POA: Diagnosis not present

## 2017-05-25 DIAGNOSIS — J449 Chronic obstructive pulmonary disease, unspecified: Secondary | ICD-10-CM | POA: Diagnosis not present

## 2017-05-27 DIAGNOSIS — Z23 Encounter for immunization: Secondary | ICD-10-CM | POA: Diagnosis not present

## 2017-05-27 DIAGNOSIS — D508 Other iron deficiency anemias: Secondary | ICD-10-CM | POA: Diagnosis not present

## 2017-06-02 DIAGNOSIS — Z720 Tobacco use: Secondary | ICD-10-CM | POA: Diagnosis not present

## 2017-06-02 DIAGNOSIS — N183 Chronic kidney disease, stage 3 (moderate): Secondary | ICD-10-CM | POA: Diagnosis not present

## 2017-06-02 DIAGNOSIS — I129 Hypertensive chronic kidney disease with stage 1 through stage 4 chronic kidney disease, or unspecified chronic kidney disease: Secondary | ICD-10-CM | POA: Diagnosis not present

## 2017-06-02 DIAGNOSIS — E669 Obesity, unspecified: Secondary | ICD-10-CM | POA: Diagnosis not present

## 2017-06-02 DIAGNOSIS — E1122 Type 2 diabetes mellitus with diabetic chronic kidney disease: Secondary | ICD-10-CM | POA: Diagnosis not present

## 2017-06-09 DIAGNOSIS — N183 Chronic kidney disease, stage 3 (moderate): Secondary | ICD-10-CM | POA: Diagnosis not present

## 2017-06-22 DIAGNOSIS — Z716 Tobacco abuse counseling: Secondary | ICD-10-CM | POA: Diagnosis not present

## 2017-06-22 DIAGNOSIS — R69 Illness, unspecified: Secondary | ICD-10-CM | POA: Diagnosis not present

## 2017-06-22 DIAGNOSIS — F411 Generalized anxiety disorder: Secondary | ICD-10-CM | POA: Diagnosis not present

## 2017-06-22 DIAGNOSIS — J449 Chronic obstructive pulmonary disease, unspecified: Secondary | ICD-10-CM | POA: Diagnosis not present

## 2017-06-25 DIAGNOSIS — J449 Chronic obstructive pulmonary disease, unspecified: Secondary | ICD-10-CM | POA: Diagnosis not present

## 2017-07-01 DIAGNOSIS — J209 Acute bronchitis, unspecified: Secondary | ICD-10-CM | POA: Diagnosis not present

## 2017-07-01 DIAGNOSIS — J42 Unspecified chronic bronchitis: Secondary | ICD-10-CM | POA: Diagnosis not present

## 2017-07-12 DIAGNOSIS — R1032 Left lower quadrant pain: Secondary | ICD-10-CM | POA: Diagnosis not present

## 2017-07-12 DIAGNOSIS — R112 Nausea with vomiting, unspecified: Secondary | ICD-10-CM | POA: Diagnosis not present

## 2017-07-12 DIAGNOSIS — R197 Diarrhea, unspecified: Secondary | ICD-10-CM | POA: Diagnosis not present

## 2017-07-12 DIAGNOSIS — R1084 Generalized abdominal pain: Secondary | ICD-10-CM | POA: Diagnosis not present

## 2017-07-25 DIAGNOSIS — J449 Chronic obstructive pulmonary disease, unspecified: Secondary | ICD-10-CM | POA: Diagnosis not present

## 2017-07-30 DIAGNOSIS — K219 Gastro-esophageal reflux disease without esophagitis: Secondary | ICD-10-CM | POA: Diagnosis not present

## 2017-07-30 DIAGNOSIS — E119 Type 2 diabetes mellitus without complications: Secondary | ICD-10-CM | POA: Diagnosis not present

## 2017-07-30 DIAGNOSIS — R69 Illness, unspecified: Secondary | ICD-10-CM | POA: Diagnosis not present

## 2017-07-31 DIAGNOSIS — R69 Illness, unspecified: Secondary | ICD-10-CM | POA: Diagnosis not present

## 2017-07-31 DIAGNOSIS — E119 Type 2 diabetes mellitus without complications: Secondary | ICD-10-CM | POA: Insufficient documentation

## 2017-07-31 DIAGNOSIS — E118 Type 2 diabetes mellitus with unspecified complications: Secondary | ICD-10-CM | POA: Insufficient documentation

## 2017-09-08 DIAGNOSIS — F17219 Nicotine dependence, cigarettes, with unspecified nicotine-induced disorders: Secondary | ICD-10-CM | POA: Insufficient documentation

## 2017-09-08 DIAGNOSIS — I129 Hypertensive chronic kidney disease with stage 1 through stage 4 chronic kidney disease, or unspecified chronic kidney disease: Secondary | ICD-10-CM | POA: Insufficient documentation

## 2017-09-08 DIAGNOSIS — Z720 Tobacco use: Secondary | ICD-10-CM | POA: Insufficient documentation

## 2017-09-08 DIAGNOSIS — N183 Chronic kidney disease, stage 3 unspecified: Secondary | ICD-10-CM

## 2017-09-08 HISTORY — DX: Hypertensive chronic kidney disease with stage 1 through stage 4 chronic kidney disease, or unspecified chronic kidney disease: I12.9

## 2017-09-08 HISTORY — DX: Chronic kidney disease, stage 3 unspecified: N18.30

## 2017-10-29 DIAGNOSIS — E782 Mixed hyperlipidemia: Secondary | ICD-10-CM

## 2017-10-29 HISTORY — DX: Mixed hyperlipidemia: E78.2

## 2018-08-11 HISTORY — PX: CERVICAL SPINE SURGERY: SHX589

## 2018-09-22 DIAGNOSIS — Z79899 Other long term (current) drug therapy: Secondary | ICD-10-CM | POA: Insufficient documentation

## 2018-10-08 DIAGNOSIS — M4802 Spinal stenosis, cervical region: Secondary | ICD-10-CM

## 2018-10-08 HISTORY — DX: Spinal stenosis, cervical region: M48.02

## 2019-02-05 DIAGNOSIS — Z981 Arthrodesis status: Secondary | ICD-10-CM | POA: Insufficient documentation

## 2019-03-01 DIAGNOSIS — R1319 Other dysphagia: Secondary | ICD-10-CM | POA: Insufficient documentation

## 2019-04-07 DIAGNOSIS — H8113 Benign paroxysmal vertigo, bilateral: Secondary | ICD-10-CM | POA: Insufficient documentation

## 2019-06-30 DIAGNOSIS — I10 Essential (primary) hypertension: Secondary | ICD-10-CM | POA: Diagnosis not present

## 2019-07-31 DIAGNOSIS — I671 Cerebral aneurysm, nonruptured: Secondary | ICD-10-CM | POA: Insufficient documentation

## 2019-07-31 HISTORY — DX: Cerebral aneurysm, nonruptured: I67.1

## 2019-08-22 DIAGNOSIS — I671 Cerebral aneurysm, nonruptured: Secondary | ICD-10-CM | POA: Insufficient documentation

## 2019-12-25 DIAGNOSIS — R0602 Shortness of breath: Secondary | ICD-10-CM | POA: Insufficient documentation

## 2020-05-02 ENCOUNTER — Other Ambulatory Visit: Payer: Self-pay | Admitting: Neurosurgery

## 2020-05-02 DIAGNOSIS — I671 Cerebral aneurysm, nonruptured: Secondary | ICD-10-CM

## 2020-05-11 ENCOUNTER — Other Ambulatory Visit: Payer: Medicare HMO

## 2020-05-25 ENCOUNTER — Ambulatory Visit
Admission: RE | Admit: 2020-05-25 | Discharge: 2020-05-25 | Disposition: A | Discharge status: Home / Self Care | Payer: Medicare Other | Source: Ambulatory Visit | Attending: Neurosurgery | Admitting: Neurosurgery

## 2020-05-25 ENCOUNTER — Other Ambulatory Visit: Payer: Self-pay

## 2020-05-25 DIAGNOSIS — I671 Cerebral aneurysm, nonruptured: Secondary | ICD-10-CM

## 2020-05-26 ENCOUNTER — Other Ambulatory Visit: Payer: Medicare Other

## 2020-10-14 DIAGNOSIS — B370 Candidal stomatitis: Secondary | ICD-10-CM | POA: Insufficient documentation

## 2020-10-30 LAB — HM MAMMOGRAPHY

## 2020-12-06 DIAGNOSIS — J988 Other specified respiratory disorders: Secondary | ICD-10-CM | POA: Insufficient documentation

## 2020-12-06 DIAGNOSIS — Z20822 Contact with and (suspected) exposure to covid-19: Secondary | ICD-10-CM | POA: Insufficient documentation

## 2020-12-06 DIAGNOSIS — R110 Nausea: Secondary | ICD-10-CM | POA: Insufficient documentation

## 2020-12-06 DIAGNOSIS — R195 Other fecal abnormalities: Secondary | ICD-10-CM | POA: Insufficient documentation

## 2021-01-09 DIAGNOSIS — G4733 Obstructive sleep apnea (adult) (pediatric): Secondary | ICD-10-CM | POA: Insufficient documentation

## 2021-01-09 DIAGNOSIS — F17210 Nicotine dependence, cigarettes, uncomplicated: Secondary | ICD-10-CM | POA: Insufficient documentation

## 2021-01-09 DIAGNOSIS — J9611 Chronic respiratory failure with hypoxia: Secondary | ICD-10-CM | POA: Insufficient documentation

## 2021-01-09 HISTORY — DX: Obstructive sleep apnea (adult) (pediatric): G47.33

## 2021-01-09 HISTORY — DX: Chronic respiratory failure with hypoxia: J96.11

## 2021-03-07 ENCOUNTER — Other Ambulatory Visit: Payer: Self-pay

## 2021-03-07 ENCOUNTER — Ambulatory Visit (INDEPENDENT_AMBULATORY_CARE_PROVIDER_SITE_OTHER): Payer: Medicare Other | Admitting: Legal Medicine

## 2021-03-07 ENCOUNTER — Encounter: Payer: Self-pay | Admitting: Legal Medicine

## 2021-03-07 VITALS — BP 110/70 | HR 80 | Temp 97.4°F | Resp 16 | Ht 63.5 in | Wt 195.0 lb

## 2021-03-07 DIAGNOSIS — I129 Hypertensive chronic kidney disease with stage 1 through stage 4 chronic kidney disease, or unspecified chronic kidney disease: Secondary | ICD-10-CM

## 2021-03-07 DIAGNOSIS — J449 Chronic obstructive pulmonary disease, unspecified: Secondary | ICD-10-CM | POA: Insufficient documentation

## 2021-03-07 DIAGNOSIS — J9611 Chronic respiratory failure with hypoxia: Secondary | ICD-10-CM | POA: Diagnosis not present

## 2021-03-07 DIAGNOSIS — N183 Chronic kidney disease, stage 3 unspecified: Secondary | ICD-10-CM

## 2021-03-07 DIAGNOSIS — E782 Mixed hyperlipidemia: Secondary | ICD-10-CM

## 2021-03-07 DIAGNOSIS — J410 Simple chronic bronchitis: Secondary | ICD-10-CM

## 2021-03-07 DIAGNOSIS — G4733 Obstructive sleep apnea (adult) (pediatric): Secondary | ICD-10-CM

## 2021-03-07 DIAGNOSIS — E1121 Type 2 diabetes mellitus with diabetic nephropathy: Secondary | ICD-10-CM | POA: Insufficient documentation

## 2021-03-07 DIAGNOSIS — I252 Old myocardial infarction: Secondary | ICD-10-CM

## 2021-03-07 DIAGNOSIS — E559 Vitamin D deficiency, unspecified: Secondary | ICD-10-CM | POA: Insufficient documentation

## 2021-03-07 HISTORY — DX: Old myocardial infarction: I25.2

## 2021-03-07 NOTE — Progress Notes (Signed)
New Patient Office Visit  Subjective:  Patient ID: Kaitlyn Rosales, female    DOB: 1955/10/17  Age: 65 y.o. MRN: [id number]  CC:  Chief Complaint  Patient presents with   New Patient (Initial Visit)   Diabetes    HPI Kaitlyn Rosales presents for establish.  Chronic COPD, has trelegy, nebulizer and 3L/min O2 nasal canula.  Patient present with type 2 diabetes.  Specifically, this is type 2, nonnsulin requiring diabetes, complicated by renal failure.  Compliance with treatment has been good; patient take medicines as directed, maintains diet and exercise regimen, follows up as directed, and is keeping glucose diary.  Date of  diagnosis 2019.  Depression screen has been performed.Tobacco screen smoker. Current medicines for diabetes lisipril.  Patient is on none for renal protection and atorvastatin for cholesterol control.  Patient performs foot exams daily and last ophthalmologic exam was yes  Patient presents for follow up of hypertension.  Patient tolerating lisinpril well with side effects.  Patient was diagnosed with hypertension 2010 so has been treated for hypertension for 10 years.Patient is working on maintaining diet and exercise regimen and follows up as directed. Complication include none  Patient presents with hyperlipidemia.  Compliance with treatment has been good; patient takes medicines as directed, maintains low cholesterol diet, follows up as directed, and maintains exercise regimen.  Patient is using atorvastatin without problems. . .     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 Added automatically from request for surgery [id number]   Chronic respiratory failure with hypoxia ([place]) 01/09/2021   Gastroesophageal reflux  disease without esophagitis 05/05/2017   History of MI (myocardial infarction) 03/07/2021   Formatting of this note might be different from the original. living in [place], never cathed (early 40s ~) patient reports MD said "light heart attack"   Mixed hyperlipidemia 10/29/2017   OSA (obstructive sleep apnea) 01/09/2021   Posttraumatic stress disorder 10/15/2016    Past Surgical History:  Procedure Laterality Date   [place]  2020   CHOLECYSTECTOMY  1970   ELBOW SURGERY Right 1990   PARTIAL HYSTERECTOMY  1990    Family History  Problem Relation Age of Onset   Dementia Mother    Parkinson's disease Mother    Cirrhosis Father    Osteoarthritis Sister    Breast cancer Sister    COPD Sister    COPD Brother     Social History   Socioeconomic History   Marital status: Single    Spouse name: Not on file   Number of children: 3   Years of education: Not on file   Highest education level: High school graduate  Occupational History   Occupation: Disabled  Tobacco Use   Smoking status: Every Day    Packs/day: 0.30    Types: Cigarettes   Smokeless tobacco: Never  Vaping Use   Vaping Use: Never used  Substance and Sexual Activity   Alcohol use: Not Currently   Drug use: Never   Sexual activity: Yes    Partners: Male  Other Topics Concern   Not on file  Social History Narrative   Not on file   Social Determinants of Health   Financial Resource Strain: Not on file  Food  Insecurity: Not on file  Transportation Needs: Not on file  Physical Activity: Not on file  Stress: Not on file  Social Connections: Not on file  Intimate Partner Violence: Not on file    ROS Review of Systems  Constitutional:  Negative for activity change and appetite change.  HENT:  Negative for congestion.   Eyes:  Negative for visual disturbance.  Respiratory:  Negative for chest tightness and shortness of breath.   Cardiovascular:  Negative for chest pain,  palpitations and leg swelling.  Gastrointestinal:  Negative for abdominal distention and abdominal pain.  Genitourinary:  Negative for difficulty urinating and dysuria.  Musculoskeletal:  Negative for arthralgias and back pain.  Neurological: Negative.   Psychiatric/Behavioral: Negative.     Objective:   Today's Vitals: BP 110/70   Pulse 80   Temp (!) 97.4 F (36.3 C)   Resp 16   Ht 5' 3.5" (1.613 m)   Wt 195 lb (88.5 kg)   LMP  (LMP Unknown)   SpO2 97%   BMI 34.00 kg/m   Physical Exam Vitals reviewed.  Constitutional:      Appearance: Normal appearance. She is obese.  HENT:     Head: Normocephalic.     Right Ear: Tympanic membrane, ear canal and external ear normal.     Left Ear: Tympanic membrane, ear canal and external ear normal.     Nose: Nose normal.     Mouth/Throat:     Mouth: Mucous membranes are moist.     Pharynx: Oropharynx is clear.  Eyes:     Extraocular Movements: Extraocular movements intact.     Conjunctiva/sclera: Conjunctivae normal.     Pupils: Pupils are equal, round, and reactive to light.  Cardiovascular:     Rate and Rhythm: Normal rate and regular rhythm.     Pulses: Normal pulses.     Heart sounds: Normal heart sounds. No murmur heard.   No gallop.  Pulmonary:     Effort: Pulmonary effort is normal. No respiratory distress.     Breath sounds: Normal breath sounds. No wheezing.  Abdominal:     General: Abdomen is flat. Bowel sounds are normal. There is no distension.     Tenderness: There is no abdominal tenderness.  Musculoskeletal:        General: Normal range of motion.     Cervical back: Normal range of motion and neck supple.     Right lower leg: No edema.     Left lower leg: No edema.  Skin:    General: Skin is warm and dry.     Capillary Refill: Capillary refill takes less than 2 seconds.  Neurological:     General: No focal deficit present.     Mental Status: She is alert and oriented to person, place, and time. Mental status  is at baseline.  Psychiatric:        Mood and Affect: Mood normal.        Behavior: Behavior normal.        Thought Content: Thought content normal.    Assessment & Plan:   Problem List Items Addressed This Visit       Cardiovascular and Mediastinum   Benign hypertension with CKD (chronic kidney disease) stage III (HCC) - Primary   Relevant Medications   atorvastatin (LIPITOR) 40 MG tablet   lisinopril (ZESTRIL) 10 MG tablet   Other Relevant Orders   CBC with Differential/Platelet An individual hypertension care plan was established and reinforced today.  The  patient's status was assessed using clinical findings on exam and labs or diagnostic tests. The patient's success at meeting treatment goals on disease specific evidence-based guidelines and found to be well controlled. SELF MANAGEMENT: The patient and I together assessed ways to personally work towards obtaining the recommended goals. RECOMMENDATIONS: avoid decongestants found in common cold remedies, decrease consumption of alcohol, perform routine monitoring of BP with home BP cuff, exercise, reduction of dietary salt, take medicines as prescribed, try not to miss doses and quit smoking.  Regular exercise and maintaining a healthy weight is needed.  Stress reduction may help. A CLINICAL SUMMARY including written plan identify barriers to care unique to individual due to social or financial issues.  We attempt to mutually creat solutions for individual and family understanding.      Respiratory   COPD (chronic obstructive pulmonary disease) (HCC)   Relevant Medications   albuterol (PROVENTIL) (2.5 MG/3ML) 0.083% nebulizer solution   Fluticasone-Umeclidin-Vilant (TRELEGY ELLIPTA) 100-62.5-25 MCG/INH AEPB   Other Relevant Orders   Comprehensive metabolic panel An individualize plan was formulated for care of COPD.  Treatment is evidence based.  She will continue on inhalers, avoid smoking and smoke.  Regular exercise with help  with dyspnea. Routine follow ups and medication compliance is needed.     Chronic respiratory failure with hypoxia (HCC) Patient is on O2 3L/min    OSA (obstructive sleep apnea) Using CPAP consistently every night and medically benefiting from its use.      Endocrine   Diabetic glomerulopathy (HCC)   Relevant Medications   atorvastatin (LIPITOR) 40 MG tablet   lisinopril (ZESTRIL) 10 MG tablet   Other Relevant Orders   Hemoglobin A1c An individual care plan for diabetes was established and reinforced today.  The patient's status was assessed using clinical findings on exam, labs and diagnostic testing. Patient success at meeting goals based on disease specific evidence-based guidelines and found to be fair controlled. Medications were assessed and patient's understanding of the medical issues , including barriers were assessed. Recommend adherence to a diabetic diet, a graduated exercise program, HgbA1c level is checked quarterly, and urine microalbumin performed yearly .  Annual mono-filament sensation testing performed. Lower blood pressure and control hyperlipidemia is important. Get annual eye exams and annual flu shots and smoking cessation discussed.  Self management goals were discussed.      Other   Mixed hyperlipidemia   Relevant Medications   atorvastatin (LIPITOR) 40 MG tablet   lisinopril (ZESTRIL) 10 MG tablet   Other Relevant Orders   Lipid panel AN INDIVIDUAL CARE PLAN for hyperlipidemia/ cholesterol was established and reinforced today.  The patient's status was assessed using clinical findings on exam, lab and other diagnostic tests. The patient's disease status was assessed based on evidence-based guidelines and found to be fair controlled. MEDICATIONS were reviewed. SELF MANAGEMENT GOALS have been discussed and patient's success at attaining the goal of low cholesterol was assessed. RECOMMENDATION given include regular exercise 3 days a week and low cholesterol/low fat  diet. CLINICAL SUMMARY including written plan to identify barriers unique to the patient due to social or economic  reasons was discussed.    Hypovitaminosis D AN INDIVIDUAL CARE PLAN hypovitaminosis D was established and reinforced today.  The patient's status was assessed using clinical findings on exam, labs, and other diagnostic testing. Patient's success at meeting treatment goals based on disease specific evidence-bassed guidelines and found to be in good control. RECOMMENDATIONS include maintaining present medicines and treatment.  Outpatient Encounter Medications as of 03/07/2021  Medication Sig   albuterol (PROVENTIL) (2.5 MG/3ML) 0.083% nebulizer solution INHALE 1 VIAL VIA NEBULIZER EVERY 6 HOURS AS NEEDED FOR WHEEZING   atorvastatin (LIPITOR) 40 MG tablet Take 1 tablet by mouth daily.   Cholecalciferol 25 MCG (1000 UT) tablet Take by mouth.   cyclobenzaprine (FLEXERIL) 5 MG tablet Take 1 tablet by mouth 3 (three) times daily as needed.   diazepam (VALIUM) 5 MG tablet Take one tablet daily for anxiety and panic attacks as needed   ferrous sulfate 325 (65 FE) MG tablet Take by mouth.   Fluticasone-Umeclidin-Vilant (TRELEGY ELLIPTA) 100-62.5-25 MCG/INH AEPB 1 Puff Daily.Marland KitchenMarland KitchenRinse mouth after use.   Lactobacillus (ACIDOPHILUS) 100 MG CAPS Take by mouth.   lisinopril (ZESTRIL) 10 MG tablet TAKE ONE TABLET DAILY IN THE MORNING FOR BLOOD PRESSURE   meclizine (ANTIVERT) 25 MG tablet Take by mouth.   ondansetron (ZOFRAN) 8 MG tablet Take by mouth.   OXYGEN 2 L by Does not apply route at bedtime.   pantoprazole (PROTONIX) 40 MG tablet TAKE 1 TABLET BY MOUTH ONCE DAILY FOR STOMACH/REFLUX   Vitamin A 2400 MCG (8000 UT) CAPS Take by mouth.   vitamin C (ASCORBIC ACID) 500 MG tablet Take 500 mg by mouth daily.   vitamin E 180 MG (400 UNITS) capsule Take 400 Units by mouth daily.   No facility-administered encounter medications on file as of 03/07/2021.    Follow-up: Return in about 3 months  (around 06/07/2021) for  chronic follow up with sally.   Reinaldo Meeker, MD

## 2021-03-08 ENCOUNTER — Telehealth: Payer: Self-pay

## 2021-03-08 LAB — COMPREHENSIVE METABOLIC PANEL
ALT: 15 IU/L (ref 0–32)
AST: 16 IU/L (ref 0–40)
Albumin/Globulin Ratio: 2.2 (ref 1.2–2.2)
Albumin: 4.4 g/dL (ref 3.8–4.8)
Alkaline Phosphatase: 123 IU/L — ABNORMAL HIGH (ref 44–121)
BUN/Creatinine Ratio: 11 — ABNORMAL LOW (ref 12–28)
BUN: 11 mg/dL (ref 8–27)
Bilirubin Total: 0.3 mg/dL (ref 0.0–1.2)
CO2: 26 mmol/L (ref 20–29)
Calcium: 9.9 mg/dL (ref 8.7–10.3)
Chloride: 99 mmol/L (ref 96–106)
Creatinine, Ser: 0.97 mg/dL (ref 0.57–1.00)
Globulin, Total: 2 g/dL (ref 1.5–4.5)
Glucose: 87 mg/dL (ref 65–99)
Potassium: 4.6 mmol/L (ref 3.5–5.2)
Sodium: 140 mmol/L (ref 134–144)
Total Protein: 6.4 g/dL (ref 6.0–8.5)
eGFR: 65 mL/min/{1.73_m2} (ref >59)

## 2021-03-08 LAB — CARDIOVASCULAR RISK ASSESSMENT

## 2021-03-08 LAB — CBC WITH DIFFERENTIAL/PLATELET
Basophils Absolute: 0.1 10*3/uL (ref 0.0–0.2)
Basos: 0 %
EOS (ABSOLUTE): 0.2 10*3/uL (ref 0.0–0.4)
Eos: 1 %
Hematocrit: 45 % (ref 34.0–46.6)
Hemoglobin: 14.8 g/dL (ref 11.1–15.9)
Immature Grans (Abs): 0 10*3/uL (ref 0.0–0.1)
Immature Granulocytes: 0 %
Lymphocytes Absolute: 4.3 10*3/uL — ABNORMAL HIGH (ref 0.7–3.1)
Lymphs: 30 %
MCH: 31 pg (ref 26.6–33.0)
MCHC: 32.9 g/dL (ref 31.5–35.7)
MCV: 94 fL (ref 79–97)
Monocytes Absolute: 1.1 10*3/uL — ABNORMAL HIGH (ref 0.1–0.9)
Monocytes: 7 %
Neutrophils Absolute: 8.6 10*3/uL — ABNORMAL HIGH (ref 1.4–7.0)
Neutrophils: 62 %
Platelets: 240 10*3/uL (ref 150–450)
RBC: 4.78 x10E6/uL (ref 3.77–5.28)
RDW: 12.4 % (ref 11.7–15.4)
WBC: 14.2 10*3/uL — ABNORMAL HIGH (ref 3.4–10.8)

## 2021-03-08 LAB — LIPID PANEL
Chol/HDL Ratio: 3.4 ratio (ref 0.0–4.4)
Cholesterol, Total: 169 mg/dL (ref 100–199)
HDL: 50 mg/dL (ref >39)
LDL Chol Calc (NIH): 89 mg/dL (ref 0–99)
Triglycerides: 175 mg/dL — ABNORMAL HIGH (ref 0–149)
VLDL Cholesterol Cal: 30 mg/dL (ref 5–40)

## 2021-03-08 LAB — HEMOGLOBIN A1C
Est. average glucose Bld gHb Est-mCnc: 128 mg/dL
Hgb A1c MFr Bld: 6.1 % — ABNORMAL HIGH (ref 4.8–5.6)

## 2021-03-08 NOTE — Progress Notes (Signed)
I think this is  your lab lp

## 2021-03-08 NOTE — Telephone Encounter (Signed)
Pt calling states she has sore throat. Denies other sinus/cold sxs. Did not have sxs at [place] visit.   Spoke w/ Dr. Henrene Pastor, who pt saw yesterday. Dr recommended salt water gargles. Called pt gave recommendations, pt VU.   [name] 03/08/21 8:38 AM

## 2021-03-18 ENCOUNTER — Telehealth: Payer: Self-pay

## 2021-03-18 ENCOUNTER — Other Ambulatory Visit: Payer: Self-pay

## 2021-03-18 MED ORDER — ONETOUCH ULTRA 2 W/DEVICE KIT: 1.0000 | PACK | Freq: Every day | 0 refills | Status: DC

## 2021-03-18 NOTE — Telephone Encounter (Signed)
Patient called asking for bloodwork results. I gave the results per Dr Henrene Pastor. We will keep an eye in WBC. I will send a new glucometer (one touch ultra II).

## 2021-03-19 ENCOUNTER — Other Ambulatory Visit: Payer: Self-pay

## 2021-03-19 DIAGNOSIS — E119 Type 2 diabetes mellitus without complications: Secondary | ICD-10-CM

## 2021-03-19 MED ORDER — GLUCOSE BLOOD VI STRP: ORAL_STRIP | 12 refills | Status: DC

## 2021-03-19 NOTE — Progress Notes (Signed)
Pt needing strips for BG machine.  Sent in.   [name], [place] 03/19/21 1:56 PM

## 2021-03-26 ENCOUNTER — Other Ambulatory Visit: Payer: Self-pay

## 2021-03-26 DIAGNOSIS — E119 Type 2 diabetes mellitus without complications: Secondary | ICD-10-CM

## 2021-03-26 MED ORDER — GLUCOSE BLOOD VI STRP: ORAL_STRIP | 10 refills | Status: DC

## 2021-03-26 NOTE — Telephone Encounter (Signed)
Order was sent by clinic on 8/9. Pharmacy did not receive this order. Will resend.   Kaitlyn Rosales, [place] 03/26/21 11:31 AM

## 2021-04-09 ENCOUNTER — Other Ambulatory Visit: Payer: Self-pay

## 2021-04-09 ENCOUNTER — Ambulatory Visit: Payer: Medicare Other | Admitting: Physician Assistant

## 2021-04-09 ENCOUNTER — Encounter: Payer: Self-pay | Admitting: Legal Medicine

## 2021-04-09 ENCOUNTER — Ambulatory Visit (INDEPENDENT_AMBULATORY_CARE_PROVIDER_SITE_OTHER): Payer: Medicare Other | Admitting: Legal Medicine

## 2021-04-09 VITALS — BP 140/70 | HR 95 | Temp 97.8°F | Resp 16 | Ht 63.5 in | Wt 198.0 lb

## 2021-04-09 DIAGNOSIS — N3281 Overactive bladder: Secondary | ICD-10-CM

## 2021-04-09 DIAGNOSIS — M47816 Spondylosis without myelopathy or radiculopathy, lumbar region: Secondary | ICD-10-CM

## 2021-04-09 DIAGNOSIS — R3915 Urgency of urination: Secondary | ICD-10-CM

## 2021-04-09 LAB — POCT URINALYSIS DIP (CLINITEK)
Bilirubin, UA: NEGATIVE
Blood, UA: NEGATIVE
Glucose, UA: NEGATIVE mg/dL
Ketones, POC UA: NEGATIVE mg/dL
Leukocytes, UA: NEGATIVE
Nitrite, UA: NEGATIVE
POC PROTEIN,UA: NEGATIVE
Spec Grav, UA: 1.015 (ref 1.010–1.025)
Urobilinogen, UA: 2 E.U./dL — AB
pH, UA: 6.5 (ref 5.0–8.0)

## 2021-04-09 NOTE — Progress Notes (Signed)
Established Patient Office Visit  Subjective:  Patient ID: Kaitlyn Rosales, female    DOB: [birth year]  Age: 65 y.o. MRN: [id number]  CC:  Chief Complaint  Patient presents with   Urinary Tract Infection   Back Pain    HPI Kaitlyn Rosales presents for right sided back pain for one week, no radiation.  Urinate a lot a night and had oab.  She gets incontinence.  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 Added automatically from request for surgery [phone]   Chronic respiratory failure with hypoxia ([place]) 01/09/2021   Gastroesophageal reflux disease without esophagitis 05/05/2017   History of MI (myocardial infarction) 03/07/2021   Formatting of this note might be different from the original. living in [place], never cathed (early 40s ~) patient reports MD said "light heart attack"   Mixed hyperlipidemia 10/29/2017   OSA (obstructive sleep apnea) 01/09/2021   Posttraumatic stress disorder 10/15/2016    Past Surgical History:  Procedure Laterality Date   [place]  2020   CHOLECYSTECTOMY  1970   ELBOW SURGERY Right 1990   PARTIAL HYSTERECTOMY  1990    Family History  Problem Relation Age of Onset   Dementia Mother    Parkinson's disease Mother    Cirrhosis Father    Osteoarthritis Sister    Breast cancer Sister    COPD Sister    COPD Brother     Social History   Socioeconomic History   Marital status: Single    Spouse name: Not on file   Number of children: 3   Years of education: Not on file   Highest education level: High school graduate  Occupational History   Occupation: Disabled  Tobacco Use   Smoking status: Every Day    Packs/day: 0.50    Types: Cigarettes   Smokeless tobacco: Never   Vaping Use   Vaping Use: Never used  Substance and Sexual Activity   Alcohol use: Not Currently   Drug use: Never   Sexual activity: Yes    Partners: Male  Other Topics Concern   Not on file  Social History Narrative   Not on file   Social Determinants of Health   Financial Resource Strain: Not on file  Food Insecurity: Not on file  Transportation Needs: Not on file  Physical Activity: Not on file  Stress: Not on file  Social Connections: Not on file  Intimate Partner Violence: Not on file    Outpatient Medications Prior to Visit  Medication Sig Dispense Refill   albuterol (PROVENTIL) (2.5 MG/3ML) 0.083% nebulizer solution INHALE 1 VIAL VIA NEBULIZER EVERY 6 HOURS AS NEEDED FOR WHEEZING     atorvastatin (LIPITOR) 40 MG tablet Take 1 tablet by mouth daily.     Blood Glucose Monitoring Suppl (ONE TOUCH ULTRA 2) w/Device KIT 1 each by Does not apply route daily. 1 kit 0   Cholecalciferol 25 MCG (1000 UT) tablet Take by mouth.     cyclobenzaprine (FLEXERIL) 5 MG tablet Take 1 tablet by mouth 3 (three) times daily as needed.     diazepam (VALIUM) 5 MG tablet Take one tablet daily for anxiety and panic attacks as needed     ferrous sulfate 325 (65 FE) MG  tablet Take by mouth.     Fluticasone-Umeclidin-Vilant (TRELEGY ELLIPTA) 100-62.5-25 MCG/INH AEPB 1 Puff Daily.Marland KitchenMarland KitchenRinse mouth after use.     glucose blood test strip Use as instructed 100 each 10   Lactobacillus (ACIDOPHILUS) 100 MG CAPS Take by mouth.     lisinopril (ZESTRIL) 10 MG tablet TAKE ONE TABLET DAILY IN THE MORNING FOR BLOOD PRESSURE     meclizine (ANTIVERT) 25 MG tablet Take by mouth.     ondansetron (ZOFRAN) 8 MG tablet Take by mouth.     OXYGEN 2 L by Does not apply route at bedtime.     pantoprazole (PROTONIX) 40 MG tablet TAKE 1 TABLET BY MOUTH ONCE DAILY FOR STOMACH/REFLUX     Vitamin A 2400 MCG (8000 UT) CAPS Take by mouth.     vitamin C (ASCORBIC ACID) 500 MG tablet Take 500 mg by mouth daily.     vitamin E 180  MG (400 UNITS) capsule Take 400 Units by mouth daily.     No facility-administered medications prior to visit.    Allergies  Allergen Reactions   Nitrofurantoin Other (See Comments), Swelling and Hives    Required hospitalization   Oxycodone-Acetaminophen Other (See Comments)    Auditory hallucinations, unable to sleep   Tape Dermatitis    Paper tape is fine   Sulfamethoxazole-Trimethoprim Rash    ROS Review of Systems  Constitutional:  Negative for activity change and appetite change.  HENT:  Negative for congestion.   Respiratory:  Negative for chest tightness and shortness of breath.   Cardiovascular:  Negative for chest pain and palpitations.  Gastrointestinal:  Negative for abdominal distention and abdominal pain.  Musculoskeletal:  Positive for back pain.  Neurological: Negative.   Psychiatric/Behavioral: Negative.       Objective:    Physical Exam  BP 140/70   Pulse 95   Temp 97.8 F (36.6 C)   Resp 16   Ht 5' 3.5" (1.613 m)   Wt 198 lb (89.8 kg)   LMP  (LMP Unknown)   SpO2 98%   BMI 34.52 kg/m  Wt Readings from Last 3 Encounters:  04/09/21 198 lb (89.8 kg)  03/07/21 195 lb (88.5 kg)     Health Maintenance Due  Topic Date Due   OPHTHALMOLOGY EXAM  Never done   Hepatitis C Screening  Never done   TETANUS/TDAP  Never done   Zoster Vaccines- Shingrix (1 of 2) Never done   PAP SMEAR-Modifier  Never done   COLONOSCOPY (Pts 45-44yr Insurance coverage will need to be confirmed)  Never done   MAMMOGRAM  Never done   COVID-19 Vaccine (2 - J[facility]sen risk series) 12/19/2019   DEXA SCAN  Never done   PNA vac Low Risk Adult (1 of 2 - PCV13) Never done   INFLUENZA VACCINE  03/11/2021    There are no preventive care reminders to display for this patient.  No results found for: TSH Lab Results  Component Value Date   WBC 14.2 (H) 03/07/2021   HGB 14.8 03/07/2021   HCT 45.0 03/07/2021   MCV 94 03/07/2021   PLT 240 03/07/2021   Lab Results  Component  Value Date   NA 140 03/07/2021   K 4.6 03/07/2021   CO2 26 03/07/2021   GLUCOSE 87 03/07/2021   BUN 11 03/07/2021   CREATININE 0.97 03/07/2021   BILITOT 0.3 03/07/2021   ALKPHOS 123 (H) 03/07/2021   AST 16 03/07/2021   ALT 15 03/07/2021   PROT 6.4 03/07/2021  ALBUMIN 4.4 03/07/2021   CALCIUM 9.9 03/07/2021   EGFR 65 03/07/2021   Lab Results  Component Value Date   CHOL 169 03/07/2021   Lab Results  Component Value Date   HDL 50 03/07/2021   Lab Results  Component Value Date   LDLCALC 89 03/07/2021   Lab Results  Component Value Date   TRIG 175 (H) 03/07/2021   Lab Results  Component Value Date   CHOLHDL 3.4 03/07/2021   Lab Results  Component Value Date   HGBA1C 6.1 (H) 03/07/2021      Assessment & Plan:   Diagnoses and all orders for this visit: Urgency of urination -     POCT URINALYSIS DIP (CLINITEK) Urine is clear, no infection  Lumbar spondylosis Patient has history of chronic back pain and sciatica, she now has muscle strain on right die of back Treat with muscle relaxer and exercises given  OAB (overactive bladder)  Patient has OAB, start samples for 28 days, [place] #28     Follow-up: Return if symptoms worsen or fail to improve.    Reinaldo Meeker, MD

## 2021-04-09 NOTE — Patient Instructions (Signed)
Back Exercises These exercises help to make your trunk and back strong. They also help to keep the lower back flexible. Doing these exercises can help to prevent back pain or lessen existing pain. If you have back pain, try to do these exercises 2-3 times each day or as told by your doctor. As you get better, do the exercises once each day. Repeat the exercises more often as told by your doctor. To stop back pain from coming back, do the exercises once each day, or as told by your doctor. Exercises Single knee to chest Do these steps 3-5 times in a row for each leg: Lie on your back on a firm bed or the floor with your legs stretched out. Bring one knee to your chest. Grab your knee or thigh with both hands and hold them it in place. Pull on your knee until you feel a gentle stretch in your lower back or buttocks. Keep doing the stretch for 10-30 seconds. Slowly let go of your leg and straighten it. Pelvic tilt Do these steps 5-10 times in a row: Lie on your back on a firm bed or the floor with your legs stretched out. Bend your knees so they point up to the ceiling. Your feet should be flat on the floor. Tighten your lower belly (abdomen) muscles to press your lower back against the floor. This will make your tailbone point up to the ceiling instead of pointing down to your feet or the floor. Stay in this position for 5-10 seconds while you gently tighten your muscles and breathe evenly. Cat-cow Do these steps until your lower back bends more easily: Get on your hands and knees on a firm surface. Keep your hands under your shoulders, and keep your knees under your hips. You may put padding under your knees. Let your head hang down toward your chest. Tighten (contract) the muscles in your belly. Point your tailbone toward the floor so your lower back becomes rounded like the back of a cat. Stay in this position for 5 seconds. Slowly lift your head. Let the muscles of your belly relax. Point  your tailbone up toward the ceiling so your back forms a sagging arch like the back of a cow. Stay in this position for 5 seconds.  Press-ups Do these steps 5-10 times in a row: Lie on your belly (face-down) on the floor. Place your hands near your head, about shoulder-width apart. While you keep your back relaxed and keep your hips on the floor, slowly straighten your arms to raise the top half of your body and lift your shoulders. Do not use your back muscles. You may change where you place your hands in order to make yourself more comfortable. Stay in this position for 5 seconds. Slowly return to lying flat on the floor.  Bridges Do these steps 10 times in a row: Lie on your back on a firm surface. Bend your knees so they point up to the ceiling. Your feet should be flat on the floor. Your arms should be flat at your sides, next to your body. Tighten your butt muscles and lift your butt off the floor until your waist is almost as high as your knees. If you do not feel the muscles working in your butt and the back of your thighs, slide your feet 1-2 inches farther away from your butt. Stay in this position for 3-5 seconds. Slowly lower your butt to the floor, and let your butt muscles relax. If  this exercise is too easy, try doing it with your arms crossed over yourchest. Belly crunches Do these steps 5-10 times in a row: Lie on your back on a firm bed or the floor with your legs stretched out. Bend your knees so they point up to the ceiling. Your feet should be flat on the floor. Cross your arms over your chest. Tip your chin a little bit toward your chest but do not bend your neck. Tighten your belly muscles and slowly raise your chest just enough to lift your shoulder blades a tiny bit off of the floor. Avoid raising your body higher than that, because it can put too much stress on your low back. Slowly lower your chest and your head to the floor. Back lifts Do these steps 5-10 times  in a row: Lie on your belly (face-down) with your arms at your sides, and rest your forehead on the floor. Tighten the muscles in your legs and your butt. Slowly lift your chest off of the floor while you keep your hips on the floor. Keep the back of your head in line with the curve in your back. Look at the floor while you do this. Stay in this position for 3-5 seconds. Slowly lower your chest and your face to the floor. Contact a doctor if: Your back pain gets a lot worse when you do an exercise. Your back pain does not get better 2 hours after you exercise. If you have any of these problems, stop doing the exercises. Do not do them again unless your doctor says it is okay. Get help right away if: You have sudden, very bad back pain. If this happens, stop doing the exercises. Do not do them again unless your doctor says it is okay. This information is not intended to replace advice given to you by your health care provider. Make sure you discuss any questions you have with your healthcare provider. Document Revised: 04/22/2018 Document Reviewed: 04/22/2018 Elsevier Patient Education  2022 [employer].

## 2021-04-18 ENCOUNTER — Other Ambulatory Visit: Payer: Self-pay

## 2021-04-18 MED ORDER — ALBUTEROL SULFATE (2.5 MG/3ML) 0.083% IN NEBU: INHALATION_SOLUTION | RESPIRATORY_TRACT | 2 refills | Status: DC

## 2021-05-15 ENCOUNTER — Other Ambulatory Visit: Payer: Self-pay

## 2021-05-15 ENCOUNTER — Ambulatory Visit: Payer: Medicare Other | Admitting: Physician Assistant

## 2021-05-15 ENCOUNTER — Encounter: Payer: Self-pay | Admitting: Physician Assistant

## 2021-05-15 ENCOUNTER — Ambulatory Visit (INDEPENDENT_AMBULATORY_CARE_PROVIDER_SITE_OTHER): Payer: Medicare Other | Admitting: Physician Assistant

## 2021-05-15 VITALS — BP 122/70 | HR 98 | Temp 96.8°F | Ht 63.5 in | Wt 200.0 lb

## 2021-05-15 DIAGNOSIS — Z23 Encounter for immunization: Secondary | ICD-10-CM

## 2021-05-15 DIAGNOSIS — R339 Retention of urine, unspecified: Secondary | ICD-10-CM

## 2021-05-15 DIAGNOSIS — N342 Other urethritis: Secondary | ICD-10-CM | POA: Diagnosis not present

## 2021-05-15 DIAGNOSIS — M47816 Spondylosis without myelopathy or radiculopathy, lumbar region: Secondary | ICD-10-CM

## 2021-05-15 LAB — POCT URINALYSIS DIP (CLINITEK)
Bilirubin, UA: NEGATIVE
Blood, UA: NEGATIVE
Glucose, UA: NEGATIVE mg/dL
Ketones, POC UA: NEGATIVE mg/dL
Leukocytes, UA: NEGATIVE
Nitrite, UA: NEGATIVE
POC PROTEIN,UA: NEGATIVE
Spec Grav, UA: 1.01 (ref 1.010–1.025)
Urobilinogen, UA: 1 E.U./dL
pH, UA: 6.5 (ref 5.0–8.0)

## 2021-05-15 MED ORDER — GEMTESA 75 MG PO TABS: 75.0000 mg | ORAL_TABLET | Freq: Every day | ORAL | 0 refills | Status: DC

## 2021-05-15 MED ORDER — CIPROFLOXACIN HCL 250 MG PO TABS: 250.0000 mg | ORAL_TABLET | Freq: Two times a day (BID) | ORAL | 0 refills | Status: AC

## 2021-05-15 NOTE — Progress Notes (Signed)
Acute Office Visit  Subjective:    Patient ID: Kaitlyn Rosales, female    DOB: 05/06/1956, 65 y.o.   MRN: [id number]  Chief Complaint  Patient presents with   Back Pain   Urinary Retention    HPI Patient is in today for complaints of low back pain - states she has chronic issues with low back discomfort but this morning it seemed worse when she tried to get out of bed was tender and sore to lower right side  Pt with history of urinary retention and having some urine leakage - pt was given samples by Dr Henrene Pastor of [facility] and states that medication worked well for her but ran out of meds - would like to try again  Pt with some mild urine frequency and urgency for the past 2 days  Past Medical History:  Diagnosis Date   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 Added automatically from request for surgery [id number]   Chronic respiratory failure with hypoxia ([place]) 01/09/2021   Gastroesophageal reflux disease without esophagitis 05/05/2017   History of MI (myocardial infarction) 03/07/2021   Formatting of this note might be different from the original. living in [place], never cathed (early 40s ~) patient reports MD said "light heart attack"   Mixed hyperlipidemia 10/29/2017   OSA (obstructive sleep apnea) 01/09/2021   Posttraumatic stress disorder 10/15/2016    Past Surgical History:  Procedure Laterality Date   APPENDECTOMY  1970   CERVICAL SPINE SURGERY  2020   CHOLECYSTECTOMY  1970   ELBOW SURGERY Right 1990   PARTIAL HYSTERECTOMY  1990    Family History  Problem Relation Age of Onset   Dementia Mother    Parkinson's disease Mother    Cirrhosis Father    Osteoarthritis Sister    Breast cancer Sister    COPD Sister    COPD Brother     Social History   Socioeconomic  History   Marital status: Single    Spouse name: Not on file   Number of children: 3   Years of education: Not on file   Highest education level: High school graduate  Occupational History   Occupation: Disabled  Tobacco Use   Smoking status: Every Day    Packs/day: 0.50    Types: Cigarettes   Smokeless tobacco: Never  Vaping Use   Vaping Use: Never used  Substance and Sexual Activity   Alcohol use: Not Currently   Drug use: Never   Sexual activity: Yes    Partners: Male  Other Topics Concern   Not on file  Social History Narrative   Not on file   Social Determinants of Health   Financial Resource Strain: Not on file  Food Insecurity: Not on file  Transportation Needs: Not on file  Physical Activity: Not on file  Stress: Not on file  Social Connections: Not on file  Intimate Partner Violence: Not on file    Outpatient Medications Prior to Visit  Medication Sig Dispense Refill   albuterol (PROVENTIL) (2.5 MG/3ML) 0.083% nebulizer solution INHALE 1 VIAL VIA NEBULIZER EVERY 6 HOURS AS NEEDED FOR WHEEZING 75 mL 2   atorvastatin (LIPITOR) 40 MG tablet Take 1 tablet by mouth daily.     Blood Glucose Monitoring Suppl (ONE TOUCH ULTRA 2) w/Device KIT 1 each  by Does not apply route daily. 1 kit 0   Cholecalciferol 25 MCG (1000 UT) tablet Take by mouth.     cyclobenzaprine (FLEXERIL) 5 MG tablet Take 1 tablet by mouth 3 (three) times daily as needed.     diazepam (VALIUM) 5 MG tablet Take one tablet daily for anxiety and panic attacks as needed     ferrous sulfate 325 (65 FE) MG tablet Take by mouth.     Fluticasone-Umeclidin-Vilant (TRELEGY ELLIPTA) 100-62.5-25 MCG/INH AEPB 1 Puff Daily.Marland KitchenMarland KitchenRinse mouth after use.     glucose blood test strip Use as instructed 100 each 10   Lactobacillus (ACIDOPHILUS) 100 MG CAPS Take by mouth.     lisinopril (ZESTRIL) 10 MG tablet TAKE ONE TABLET DAILY IN THE MORNING FOR BLOOD PRESSURE     meclizine (ANTIVERT) 25 MG tablet Take by mouth.      ondansetron (ZOFRAN) 8 MG tablet Take by mouth.     OXYGEN 2 L by Does not apply route at bedtime.     pantoprazole (PROTONIX) 40 MG tablet TAKE 1 TABLET BY MOUTH ONCE DAILY FOR STOMACH/REFLUX     Vitamin A 2400 MCG (8000 UT) CAPS Take by mouth.     vitamin C (ASCORBIC ACID) 500 MG tablet Take 500 mg by mouth daily.     vitamin E 180 MG (400 UNITS) capsule Take 400 Units by mouth daily.     No facility-administered medications prior to visit.    Allergies  Allergen Reactions   Nitrofurantoin Other (See Comments), Swelling and Hives    Required hospitalization   Oxycodone-Acetaminophen Other (See Comments)    Auditory hallucinations, unable to sleep   Tape Dermatitis    Paper tape is fine   Sulfamethoxazole-Trimethoprim Rash    Review of Systems CONSTITUTIONAL: Negative for chills, fatigue, fever, unintentional weight gain and unintentional weight loss.  CARDIOVASCULAR: Negative for chest pain, dizziness, palpitations and pedal edema.  RESPIRATORY: Negative for recent cough and dyspnea.  GASTROINTESTINAL: Negative for abdominal pain, acid reflux symptoms, constipation, diarrhea, nausea and vomiting.  GU - see HPI MSK:see HPI INTEGUMENTARY: Negative for rash.            Objective:    Physical Exam PHYSICAL EXAM:   VS: BP 122/70 (BP Location: Left Arm, Patient Position: Sitting, Cuff Size: Normal)   Pulse 98   Temp (!) 96.8 F (36 C) (Temporal)   Ht 5' 3.5" (1.613 m)   Wt 200 lb (90.7 kg)   LMP  (LMP Unknown)   SpO2 91%   BMI 34.87 kg/m   GEN: Well nourished, well developed, in no acute distress  Cardiac: RRR; no murmurs, rubs, or gallops,no edema - Respiratory:  normal respiratory rate and pattern with no distress - normal breath sounds with no rales, rhonchi, wheezes or rubs GI: normal bowel sounds, no masses or tenderness MS: no deformity or atrophy  Psych: euthymic mood, appropriate affect and demeanor  Office Visit on 05/15/2021  Component Date Value Ref  Range Status   Color, UA 05/15/2021 yellow  yellow Final   Clarity, UA 05/15/2021 clear  clear Final   Glucose, UA 05/15/2021 negative  negative mg/dL Final   Bilirubin, UA 05/15/2021 negative  negative Final   Ketones, POC UA 05/15/2021 negative  negative mg/dL Final   Spec Grav, UA 05/15/2021 1.010  1.010 - 1.025 Final   Blood, UA 05/15/2021 negative  negative Final   pH, UA 05/15/2021 6.5  5.0 - 8.0 Final   POC PROTEIN,UA 05/15/2021 negative  negative, trace Final   Urobilinogen, UA 05/15/2021 1.0  0.2 or 1.0 E.U./dL Final   Nitrite, UA 05/15/2021 Negative  Negative Final   Leukocytes, UA 05/15/2021 Negative  Negative Final    BP 122/70 (BP Location: Left Arm, Patient Position: Sitting, Cuff Size: Normal)   Pulse 98   Temp (!) 96.8 F (36 C) (Temporal)   Ht 5' 3.5" (1.613 m)   Wt 200 lb (90.7 kg)   LMP  (LMP Unknown)   SpO2 91%   BMI 34.87 kg/m  Wt Readings from Last 3 Encounters:  05/15/21 200 lb (90.7 kg)  04/09/21 198 lb (89.8 kg)  03/07/21 195 lb (88.5 kg)    Health Maintenance Due  Topic Date Due   OPHTHALMOLOGY EXAM  Never done   Hepatitis C Screening  Never done   TETANUS/TDAP  Never done   Zoster Vaccines- Shingrix (1 of 2) Never done   COLONOSCOPY (Pts 45-68yrs Insurance coverage will need to be confirmed)  Never done   MAMMOGRAM  Never done   COVID-19 Vaccine (2 - Janssen risk series) 12/19/2019   DEXA SCAN  Never done    There are no preventive care reminders to display for this patient.   No results found for: TSH Lab Results  Component Value Date   WBC 14.2 (H) 03/07/2021   HGB 14.8 03/07/2021   HCT 45.0 03/07/2021   MCV 94 03/07/2021   PLT 240 03/07/2021   Lab Results  Component Value Date   NA 140 03/07/2021   K 4.6 03/07/2021   CO2 26 03/07/2021   GLUCOSE 87 03/07/2021   BUN 11 03/07/2021   CREATININE 0.97 03/07/2021   BILITOT 0.3 03/07/2021   ALKPHOS 123 (H) 03/07/2021   AST 16 03/07/2021   ALT 15 03/07/2021   PROT 6.4 03/07/2021    ALBUMIN 4.4 03/07/2021   CALCIUM 9.9 03/07/2021   EGFR 65 03/07/2021   Lab Results  Component Value Date   CHOL 169 03/07/2021   Lab Results  Component Value Date   HDL 50 03/07/2021   Lab Results  Component Value Date   LDLCALC 89 03/07/2021   Lab Results  Component Value Date   TRIG 175 (H) 03/07/2021   Lab Results  Component Value Date   CHOLHDL 3.4 03/07/2021   Lab Results  Component Value Date   HGBA1C 6.1 (H) 03/07/2021       Assessment & Plan:   Problem List Items Addressed This Visit       Musculoskeletal and Integument   Lumbar spondylosis Use motrin as directed   Other Visit Diagnoses     Urinary retention    -  Primary   Relevant Medications   Vibegron (GEMTESA) 75 MG TABS   Other Relevant Orders   POCT URINALYSIS DIP (CLINITEK) (Completed)   Need for prophylactic vaccination and inoculation against influenza       Relevant Orders   Flu Vaccine QUAD High Dose(Fluad) (Completed)   Urethritis          Meds ordered this encounter  Medications   Vibegron (GEMTESA) 75 MG TABS    Sig: Take 75 mg by mouth daily.    Dispense:  30 tablet    Refill:  0    Order Specific Question:   Supervising Provider    Answer:   Shelton Silvas   ciprofloxacin (CIPRO) 250 MG tablet    Sig: Take 1 tablet (250 mg total) by mouth 2 (two) times daily for 3 days.  Dispense:  6 tablet    Refill:  0    Order Specific Question:   Supervising Provider    Answer:   Shelton Silvas    Orders Placed This Encounter  Procedures   Flu Vaccine QUAD High Dose(Fluad)   POCT URINALYSIS DIP (CLINITEK)     Follow-up: No follow-ups on file.  An After Visit Summary was printed and given to the patient.  Yetta Flock [facility] [phone]

## 2021-05-17 ENCOUNTER — Other Ambulatory Visit: Payer: Self-pay | Admitting: Neurosurgery

## 2021-05-17 DIAGNOSIS — I671 Cerebral aneurysm, nonruptured: Secondary | ICD-10-CM

## 2021-05-24 ENCOUNTER — Other Ambulatory Visit: Payer: Self-pay | Admitting: Legal Medicine

## 2021-05-24 NOTE — Telephone Encounter (Signed)
Refill sent to pharmacy.   

## 2021-05-29 ENCOUNTER — Ambulatory Visit
Admission: RE | Admit: 2021-05-29 | Discharge: 2021-05-29 | Disposition: A | Discharge status: Home / Self Care | Payer: Medicare Other | Source: Ambulatory Visit | Attending: Neurosurgery | Admitting: Neurosurgery

## 2021-05-29 ENCOUNTER — Other Ambulatory Visit: Payer: Self-pay

## 2021-05-29 DIAGNOSIS — I671 Cerebral aneurysm, nonruptured: Secondary | ICD-10-CM

## 2021-06-13 ENCOUNTER — Ambulatory Visit: Payer: Medicare Other | Admitting: Physician Assistant

## 2021-06-18 ENCOUNTER — Ambulatory Visit: Payer: Medicare Other | Admitting: Physician Assistant

## 2021-06-21 ENCOUNTER — Encounter: Payer: Self-pay | Admitting: Physician Assistant

## 2021-06-21 ENCOUNTER — Ambulatory Visit (INDEPENDENT_AMBULATORY_CARE_PROVIDER_SITE_OTHER): Payer: Medicare Other | Admitting: Physician Assistant

## 2021-06-21 ENCOUNTER — Other Ambulatory Visit: Payer: Self-pay

## 2021-06-21 VITALS — BP 120/72 | HR 90 | Temp 96.6°F | Ht 63.5 in | Wt 203.0 lb

## 2021-06-21 DIAGNOSIS — E782 Mixed hyperlipidemia: Secondary | ICD-10-CM

## 2021-06-21 DIAGNOSIS — R339 Retention of urine, unspecified: Secondary | ICD-10-CM

## 2021-06-21 DIAGNOSIS — J441 Chronic obstructive pulmonary disease with (acute) exacerbation: Secondary | ICD-10-CM

## 2021-06-21 DIAGNOSIS — K219 Gastro-esophageal reflux disease without esophagitis: Secondary | ICD-10-CM | POA: Diagnosis not present

## 2021-06-21 DIAGNOSIS — Z1211 Encounter for screening for malignant neoplasm of colon: Secondary | ICD-10-CM

## 2021-06-21 DIAGNOSIS — Z1231 Encounter for screening mammogram for malignant neoplasm of breast: Secondary | ICD-10-CM

## 2021-06-21 DIAGNOSIS — R7303 Prediabetes: Secondary | ICD-10-CM

## 2021-06-21 DIAGNOSIS — I129 Hypertensive chronic kidney disease with stage 1 through stage 4 chronic kidney disease, or unspecified chronic kidney disease: Secondary | ICD-10-CM | POA: Diagnosis not present

## 2021-06-21 DIAGNOSIS — E559 Vitamin D deficiency, unspecified: Secondary | ICD-10-CM

## 2021-06-21 DIAGNOSIS — N183 Chronic kidney disease, stage 3 unspecified: Secondary | ICD-10-CM

## 2021-06-21 DIAGNOSIS — N958 Other specified menopausal and perimenopausal disorders: Secondary | ICD-10-CM

## 2021-06-21 MED ORDER — DOXYCYCLINE HYCLATE 100 MG PO TABS: 100.0000 mg | ORAL_TABLET | Freq: Two times a day (BID) | ORAL | 0 refills | Status: DC

## 2021-06-21 MED ORDER — GEMTESA 75 MG PO TABS
75.0000 mg | ORAL_TABLET | Freq: Every day | ORAL | 0 refills | Status: DC
Start: 2021-06-21 — End: 2023-11-01

## 2021-06-21 NOTE — Progress Notes (Signed)
Subjective:  Patient ID: Kaitlyn Rosales, female    DOB: 05/11/1956  Age: 65 y.o. MRN: [id number]  Chief Complaint  Patient presents with   Hypertension    HPI  Pt presents for follow up of hypertension. The patient is tolerating the medication well without side effects. Compliance with treatment has been good; including taking medication as directed , maintains a healthy diet and regular exercise regimen , and following up as directed. Currently taking zestril $RemoveBefore'10mg'dDafOeVEMKSEK$  qd  Mixed hyperlipidemia  Pt presents with hyperlipidemia. ; The patient is compliant with medications, maintains a low cholesterol diet , follows up as directed , and maintains an exercise regimen . The patient denies experiencing any hypercholesterolemia related symptoms. Currently taking lipitor $RemoveBefore'40mg'NTSfNlbDgkuvy$  qd  Pt with history of COPD - currently uses albuterol in nebulizer and stiolto - she states this week she has had more cough and congestion than usual and cough is now productive  Pt with history of prediabetes --- it was noted in chart she was diabetic however pt states she has never had that diagnosis and has always been borderline diabetic with her last hgb a1c at 6.1 - she has never been on any medications for diabetes She does check her glucose and it ranges in the 100s  Pt with history of anxiety - uses valium as needed which controls symptoms well  Pt with history of GERD - uses protonix $RemoveBefor'40mg'mlcUVSSIbpLY$  qd which controls her symptoms  Dr Henrene Pastor had started pt on gemtesa $RemoveBe'75mg'GGCoxKnZo$  qd for overactive bladder symptoms - she states this medication is working well for her and needs [facility] -- it may need PA but discussing with pt she states she has never tried another medication in past for these symptoms   Current Outpatient Medications on File Prior to Visit  Medication Sig Dispense Refill   albuterol (PROVENTIL) (2.5 MG/3ML) 0.083% nebulizer solution INHALE 1 VIAL VIA NEBULIZER EVERY 6 HOURS AS NEEDED FOR WHEEZING 75 mL 2    atorvastatin (LIPITOR) 40 MG tablet Take 1 tablet by mouth daily.     Blood Glucose Monitoring Suppl (ONE TOUCH ULTRA 2) w/Device KIT 1 each by Does not apply route daily. 1 kit 0   Cholecalciferol 25 MCG (1000 UT) tablet Take by mouth.     cyclobenzaprine (FLEXERIL) 5 MG tablet Take 1 tablet by mouth 3 (three) times daily as needed.     diazepam (VALIUM) 5 MG tablet Take one tablet daily for anxiety and panic attacks as needed     ferrous sulfate 325 (65 FE) MG tablet Take by mouth.     glucose blood test strip Use as instructed 100 each 10   Lactobacillus (ACIDOPHILUS) 100 MG CAPS Take by mouth.     lisinopril (ZESTRIL) 10 MG tablet TAKE ONE TABLET DAILY IN THE MORNING FOR BLOOD PRESSURE     meclizine (ANTIVERT) 25 MG tablet Take by mouth.     ondansetron (ZOFRAN) 8 MG tablet Take by mouth.     OXYGEN 2 L by Does not apply route at bedtime.     pantoprazole (PROTONIX) 40 MG tablet TAKE 1 TABLET BY MOUTH ONCE DAILY FOR STOMACH/REFLUX     Vitamin A 2400 MCG (8000 UT) CAPS Take by mouth.     vitamin C (ASCORBIC ACID) 500 MG tablet Take 500 mg by mouth daily.     vitamin E 180 MG (400 UNITS) capsule Take 400 Units by mouth daily.     No current facility-administered medications on file  prior to visit.   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 Added automatically from request for surgery [phone]   Chronic respiratory failure with hypoxia ([facility]) 01/09/2021   Gastroesophageal reflux disease without esophagitis 05/05/2017   History of MI (myocardial infarction) 03/07/2021   Formatting of this note might be different from the original. living in [place], never cathed (early 40s ~) patient reports MD said "light heart attack"   Mixed hyperlipidemia  10/29/2017   OSA (obstructive sleep apnea) 01/09/2021   Posttraumatic stress disorder 10/15/2016   Past Surgical History:  Procedure Laterality Date   [place]  2020   CHOLECYSTECTOMY  1970   ELBOW SURGERY Right 1990   PARTIAL HYSTERECTOMY  1990    Family History  Problem Relation Age of Onset   Dementia Mother    Parkinson's disease Mother    Cirrhosis Father    Osteoarthritis Sister    Breast cancer Sister    COPD Sister    COPD Brother    Social History   Socioeconomic History   Marital status: Single    Spouse name: Not on file   Number of children: 3   Years of education: Not on file   Highest education level: High school graduate  Occupational History   Occupation: Disabled  Tobacco Use   Smoking status: Every Day    Packs/day: 0.50    Types: Cigarettes   Smokeless tobacco: Never  Vaping Use   Vaping Use: Never used  Substance and Sexual Activity   Alcohol use: Not Currently   Drug use: Never   Sexual activity: Yes    Partners: Male  Other Topics Concern   Not on file  Social History Narrative   Not on file   Social Determinants of Health   Financial Resource Strain: Not on file  Food Insecurity: Not on file  Transportation Needs: Not on file  Physical Activity: Not on file  Stress: Not on file  Social Connections: Not on file    Review of Systems CONSTITUTIONAL: Negative for chills, fatigue, fever, unintentional weight gain and unintentional weight loss.  E/N/T: Negative for ear pain, nasal congestion and sore throat.  CARDIOVASCULAR: Negative for chest pain, dizziness, palpitations and pedal edema.  RESPIRATORY: see HPI GASTROINTESTINAL: Negative for abdominal pain, acid reflux symptoms, constipation, diarrhea, nausea and vomiting.  MSK: Negative for arthralgias and myalgias.  INTEGUMENTARY: Negative for rash.  NEUROLOGICAL: Negative for dizziness and headaches.  PSYCHIATRIC: Negative for sleep disturbance and  to question depression screen.  Negative for depression, negative for anhedonia.      Objective:  BP 120/72 (BP Location: Left Arm, Patient Position: Sitting, Cuff Size: Large)   Pulse 90   Temp (!) 96.6 F (35.9 C) (Temporal)   Ht 5' 3.5" (1.613 m)   Wt 203 lb (92.1 kg)   LMP  (LMP Unknown)   SpO2 93%   BMI 35.40 kg/m   BP/Weight 06/21/2021 05/15/2021 0/37/0488  Systolic BP 891 694 503  Diastolic BP 72 70 70  Wt. (Lbs) 203 200 198  BMI 35.4 34.87 34.52    Physical Exam PHYSICAL EXAM:   VS: BP 120/72 (BP Location: Left Arm, Patient Position: Sitting, Cuff Size: Large)   Pulse 90  Temp (!) 96.6 F (35.9 C) (Temporal)   Ht 5' 3.5" (1.613 m)   Wt 203 lb (92.1 kg)   LMP  (LMP Unknown)   SpO2 93%   BMI 35.40 kg/m   GEN: Well nourished, well developed, in no acute distress  HEENT: normal external ears and nose - normal external auditory canals and TMS -  - Lips, Teeth and Gums - normal  Oropharynx - normal mucosa, palate, and posterior pharynx Neck: no JVD or masses - no thyromegaly Cardiac: RRR; no murmurs, rubs, or gallops,no edema - no significant varicosities Respiratory:  scattered rhonchi noted GI: normal bowel sounds, no masses or tenderness Skin: warm and dry, no rash  Psych: euthymic mood, appropriate affect and demeanor  Diabetic Foot Exam - Simple   No data filed      Lab Results  Component Value Date   WBC 14.2 (H) 03/07/2021   HGB 14.8 03/07/2021   HCT 45.0 03/07/2021   PLT 240 03/07/2021   GLUCOSE 87 03/07/2021   CHOL 169 03/07/2021   TRIG 175 (H) 03/07/2021   HDL 50 03/07/2021   LDLCALC 89 03/07/2021   ALT 15 03/07/2021   AST 16 03/07/2021   NA 140 03/07/2021   K 4.6 03/07/2021   CL 99 03/07/2021   CREATININE 0.97 03/07/2021   BUN 11 03/07/2021   CO2 26 03/07/2021   HGBA1C 6.1 (H) 03/07/2021      Assessment & Plan:   Problem List Items Addressed This Visit       Cardiovascular and Mediastinum   Benign hypertension with CKD  (chronic kidney disease) stage III (HCC) - Primary   Relevant Orders   CBC with Differential/Platelet   Comprehensive metabolic panel   TSH Continue current meds     Respiratory   COPD (chronic obstructive pulmonary disease) (HCC)   Relevant Medications   doxycycline (VIBRA-TABS) 100 MG tablet Follow up with pulmonologist as directed     Digestive   Gastroesophageal reflux disease without esophagitis Continue meds     Other   Mixed hyperlipidemia   Relevant Orders   CBC with Differential/Platelet   Comprehensive metabolic panel   Lipid panel Continue meds and watch diet   Hypovitaminosis D   Relevant Orders   VITAMIN D 25 Hydroxy (Vit-D Deficiency, Fractures)   Prediabetes   Relevant Orders   Hemoglobin A1c Watch diet   Other Visit Diagnoses     Urinary retention       Relevant Medications   Vibegron (GEMTESA) 75 MG TABS   Colon cancer screening       Relevant Orders   Ambulatory referral to Gastroenterology   Encounter for screening mammogram for breast cancer       Relevant Orders   MM DIGITAL SCREENING BILATERAL   Other specified menopausal and perimenopausal disorders       Relevant Orders   DG Bone Density     .  Meds ordered this encounter  Medications   doxycycline (VIBRA-TABS) 100 MG tablet    Sig: Take 1 tablet (100 mg total) by mouth 2 (two) times daily.    Dispense:  20 tablet    Refill:  0    Order Specific Question:   Supervising Provider    Answer:   COX, Lynder Parents   Vibegron (GEMTESA) 75 MG TABS    Sig: Take 75 mg by mouth daily.    Dispense:  90 tablet    Refill:  0    Order Specific Question:  Supervising Provider    Answer:   Rochel Brome [phone]    Orders Placed This Encounter  Procedures   MM DIGITAL SCREENING BILATERAL   DG Bone Density   CBC with Differential/Platelet   Comprehensive metabolic panel   TSH   Lipid panel   Hemoglobin A1c   VITAMIN D 25 Hydroxy (Vit-D Deficiency, Fractures)   Ambulatory referral  to Gastroenterology     Follow-up: Return in about 3 months (around 09/21/2021) for chronic fasting follow up.  An After Visit Summary was printed and given to the patient.  Yetta Flock [facility] [phone]

## 2021-06-22 LAB — CBC WITH DIFFERENTIAL/PLATELET
Basophils Absolute: 0.1 10*3/uL (ref 0.0–0.2)
Basos: 1 %
EOS (ABSOLUTE): 0.3 10*3/uL (ref 0.0–0.4)
Eos: 3 %
Hematocrit: 46.7 % — ABNORMAL HIGH (ref 34.0–46.6)
Hemoglobin: 15.3 g/dL (ref 11.1–15.9)
Immature Grans (Abs): 0 10*3/uL (ref 0.0–0.1)
Immature Granulocytes: 0 %
Lymphocytes Absolute: 3.2 10*3/uL — ABNORMAL HIGH (ref 0.7–3.1)
Lymphs: 32 %
MCH: 30.2 pg (ref 26.6–33.0)
MCHC: 32.8 g/dL (ref 31.5–35.7)
MCV: 92 fL (ref 79–97)
Monocytes Absolute: 0.7 10*3/uL (ref 0.1–0.9)
Monocytes: 7 %
Neutrophils Absolute: 5.7 10*3/uL (ref 1.4–7.0)
Neutrophils: 57 %
Platelets: 191 10*3/uL (ref 150–450)
RBC: 5.06 x10E6/uL (ref 3.77–5.28)
RDW: 12.3 % (ref 11.7–15.4)
WBC: 9.9 10*3/uL (ref 3.4–10.8)

## 2021-06-22 LAB — HEMOGLOBIN A1C
Est. average glucose Bld gHb Est-mCnc: 134 mg/dL
Hgb A1c MFr Bld: 6.3 % — ABNORMAL HIGH (ref 4.8–5.6)

## 2021-06-22 LAB — LIPID PANEL
Chol/HDL Ratio: 3.9 ratio (ref 0.0–4.4)
Cholesterol, Total: 190 mg/dL (ref 100–199)
HDL: 49 mg/dL (ref >39)
LDL Chol Calc (NIH): 107 mg/dL — ABNORMAL HIGH (ref 0–99)
Triglycerides: 199 mg/dL — ABNORMAL HIGH (ref 0–149)
VLDL Cholesterol Cal: 34 mg/dL (ref 5–40)

## 2021-06-22 LAB — COMPREHENSIVE METABOLIC PANEL
ALT: 17 IU/L (ref 0–32)
AST: 16 IU/L (ref 0–40)
Albumin/Globulin Ratio: 2.5 — ABNORMAL HIGH (ref 1.2–2.2)
Albumin: 4.5 g/dL (ref 3.8–4.8)
Alkaline Phosphatase: 116 IU/L (ref 44–121)
BUN/Creatinine Ratio: 11 — ABNORMAL LOW (ref 12–28)
BUN: 10 mg/dL (ref 8–27)
Bilirubin Total: 0.4 mg/dL (ref 0.0–1.2)
CO2: 26 mmol/L (ref 20–29)
Calcium: 9.9 mg/dL (ref 8.7–10.3)
Chloride: 101 mmol/L (ref 96–106)
Creatinine, Ser: 0.91 mg/dL (ref 0.57–1.00)
Globulin, Total: 1.8 g/dL (ref 1.5–4.5)
Glucose: 99 mg/dL (ref 70–99)
Potassium: 4.7 mmol/L (ref 3.5–5.2)
Sodium: 141 mmol/L (ref 134–144)
Total Protein: 6.3 g/dL (ref 6.0–8.5)
eGFR: 70 mL/min/{1.73_m2} (ref >59)

## 2021-06-22 LAB — TSH: TSH: 2.46 u[IU]/mL (ref 0.450–4.500)

## 2021-06-22 LAB — CARDIOVASCULAR RISK ASSESSMENT

## 2021-06-22 LAB — VITAMIN D 25 HYDROXY (VIT D DEFICIENCY, FRACTURES): Vit D, 25-Hydroxy: 36.4 ng/mL (ref 30.0–100.0)

## 2021-06-25 ENCOUNTER — Telehealth: Payer: Self-pay | Admitting: Physician Assistant

## 2021-06-25 NOTE — Telephone Encounter (Signed)
   Kaitlyn Rosales has been scheduled for the following appointment:  WHAT: MAMMOGRAM/BONE DENSITY WHERE: [facility] DATE: 11/01/2021 TIME: 10:15 AM ARRIVAL TIME  Patient has been made aware.

## 2021-07-01 ENCOUNTER — Other Ambulatory Visit: Payer: Self-pay

## 2021-07-01 NOTE — Telephone Encounter (Signed)
Called patient and stated her RX was sent to pharmacy today by another Dr, patient will call back if she needs it sent in.

## 2021-07-30 ENCOUNTER — Ambulatory Visit (INDEPENDENT_AMBULATORY_CARE_PROVIDER_SITE_OTHER): Payer: Medicare Other | Admitting: Legal Medicine

## 2021-07-30 ENCOUNTER — Encounter: Payer: Self-pay | Admitting: Legal Medicine

## 2021-07-30 VITALS — BP 110/60 | HR 97 | Temp 98.3°F | Resp 16 | Ht 63.0 in | Wt 202.0 lb

## 2021-07-30 DIAGNOSIS — R051 Acute cough: Secondary | ICD-10-CM | POA: Diagnosis not present

## 2021-07-30 DIAGNOSIS — J441 Chronic obstructive pulmonary disease with (acute) exacerbation: Secondary | ICD-10-CM

## 2021-07-30 LAB — POC COVID19 BINAXNOW: SARS Coronavirus 2 Ag: NEGATIVE

## 2021-07-30 MED ORDER — AMOXICILLIN-POT CLAVULANATE 875-125 MG PO TABS: 1.0000 | ORAL_TABLET | Freq: Two times a day (BID) | ORAL | 0 refills | Status: DC

## 2021-07-30 MED ORDER — TRIAMCINOLONE ACETONIDE 40 MG/ML IJ SUSP
80.0000 mg | Freq: Once | INTRAMUSCULAR | Status: AC
  Administered 2021-07-30: 10:00:00 80 mg via INTRAMUSCULAR

## 2021-07-30 NOTE — Progress Notes (Signed)
Acute Office Visit  Subjective:    Patient ID: Kaitlyn Rosales, female    DOB: 03-Apr-1956, 65 y.o.   MRN: [id number]  Chief Complaint  Patient presents with   Headache   COPD   Wheezing    HPI: Patient is in today for COPD with exacerbation for 5 days, using nebulizer on tioproprium and albuterol.  Still cough and wheezing. 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 Added automatically from request for surgery [phone]   Chronic respiratory failure with hypoxia (HCC) 01/09/2021   Gastroesophageal reflux disease without esophagitis 05/05/2017   History of MI (myocardial infarction) 03/07/2021   Formatting of this note might be different from the original. living in [place], never cathed (early 40s ~) patient reports MD said "light heart attack"   Mixed hyperlipidemia 10/29/2017   OSA (obstructive sleep apnea) 01/09/2021   Posttraumatic stress disorder 10/15/2016    Past Surgical History:  Procedure Laterality Date   APPENDECTOMY  1970   CERVICAL SPINE SURGERY  2020   CHOLECYSTECTOMY  1970   ELBOW SURGERY Right 1990   PARTIAL HYSTERECTOMY  1990    Family History  Problem Relation Age of Onset   Dementia Mother    Parkinson's disease Mother    Cirrhosis Father    Osteoarthritis Sister    Breast cancer Sister    COPD Sister    COPD Brother     Social History   Socioeconomic History   Marital status: Single    Spouse name: Not on file   Number of children: 3   Years of education: Not on file   Highest education level: High school graduate  Occupational History   Occupation: Disabled  Tobacco Use   Smoking status: Every Day    Packs/day: 0.50    Types: Cigarettes   Smokeless tobacco: Never  Vaping Use    Vaping Use: Never used  Substance and Sexual Activity   Alcohol use: Not Currently   Drug use: Never   Sexual activity: Yes    Partners: Male  Other Topics Concern   Not on file  Social History Narrative   Not on file   Social Determinants of Health   Financial Resource Strain: Not on file  Food Insecurity: Not on file  Transportation Needs: Not on file  Physical Activity: Not on file  Stress: Not on file  Social Connections: Not on file  Intimate Partner Violence: Not on file    Outpatient Medications Prior to Visit  Medication Sig Dispense Refill   albuterol (PROVENTIL) (2.5 MG/3ML) 0.083% nebulizer solution INHALE 1 VIAL VIA NEBULIZER EVERY 6 HOURS AS NEEDED FOR WHEEZING 75 mL 2   atorvastatin (LIPITOR) 40 MG tablet Take 1 tablet by mouth daily.     Blood Glucose Monitoring Suppl (ONE TOUCH ULTRA 2) w/Device KIT 1 each by Does not apply route daily. 1 kit 0   Cholecalciferol 25 MCG (1000 UT) tablet Take by mouth.     cyclobenzaprine (FLEXERIL) 5 MG tablet Take 1 tablet by mouth 3 (three) times daily as needed.     diazepam (VALIUM) 5 MG tablet Take one tablet daily for anxiety and panic attacks as needed     ferrous sulfate 325 (65 FE) MG tablet  Take by mouth.     glucose blood test strip Use as instructed 100 each 10   Lactobacillus (ACIDOPHILUS) 100 MG CAPS Take by mouth.     lisinopril (ZESTRIL) 10 MG tablet TAKE ONE TABLET DAILY IN THE MORNING FOR BLOOD PRESSURE     meclizine (ANTIVERT) 25 MG tablet Take by mouth.     ondansetron (ZOFRAN) 8 MG tablet Take by mouth.     OXYGEN 2 L by Does not apply route at bedtime.     pantoprazole (PROTONIX) 40 MG tablet TAKE 1 TABLET BY MOUTH ONCE DAILY FOR STOMACH/REFLUX     Tiotropium Bromide-Olodaterol 2.5-2.5 MCG/ACT AERS Inhale into the lungs.     Vibegron (GEMTESA) 75 MG TABS Take 75 mg by mouth daily. 90 tablet 0   Vitamin A 2400 MCG (8000 UT) CAPS Take by mouth.     vitamin C (ASCORBIC ACID) 500 MG tablet Take 500 mg by mouth  daily.     vitamin E 180 MG (400 UNITS) capsule Take 400 Units by mouth daily.     doxycycline (VIBRA-TABS) 100 MG tablet Take 1 tablet (100 mg total) by mouth 2 (two) times daily. 20 tablet 0   No facility-administered medications prior to visit.    Allergies  Allergen Reactions   Nitrofurantoin Other (See Comments), Swelling and Hives    Required hospitalization   Oxycodone-Acetaminophen Other (See Comments)    Auditory hallucinations, unable to sleep   Tape Dermatitis    Paper tape is fine   Sulfamethoxazole-Trimethoprim Rash    Review of Systems  Constitutional:  Negative for chills, fatigue and fever.  HENT:  Positive for congestion, sinus pressure and sinus pain. Negative for ear pain and sore throat.   Respiratory:  Positive for cough, chest tightness, shortness of breath and wheezing.   Cardiovascular:  Negative for chest pain.  Gastrointestinal:  Negative for abdominal distention, abdominal pain, constipation, diarrhea, nausea and vomiting.  Musculoskeletal:  Negative for myalgias.  Neurological:  Positive for headaches.      Objective:    Physical Exam Vitals reviewed.  Constitutional:      Appearance: She is well-developed.  Eyes:     Extraocular Movements: Extraocular movements intact.     Pupils: Pupils are equal, round, and reactive to light.  Cardiovascular:     Rate and Rhythm: Normal rate and regular rhythm.     Heart sounds: Normal heart sounds. No murmur heard.   No gallop.  Pulmonary:     Effort: Pulmonary effort is normal.     Breath sounds: Wheezing present.  Abdominal:     General: Bowel sounds are normal. There is no distension.     Palpations: Abdomen is soft.  Musculoskeletal:        General: Normal range of motion.     Cervical back: Normal range of motion.  Skin:    General: Skin is warm.     Capillary Refill: Capillary refill takes less than 2 seconds.  Neurological:     Mental Status: She is alert.  Psychiatric:        Mood and  Affect: Mood normal.    BP 110/60    Pulse 97    Temp 98.3 F (36.8 C)    Resp 16    Ht $R'5\' 3"'td$  (1.6 m)    Wt 202 lb (91.6 kg)    LMP  (LMP Unknown)    SpO2 91%    BMI 35.78 kg/m  Wt Readings from Last 3 Encounters:  07/30/21 202 lb (91.6 kg)  06/21/21 203 lb (92.1 kg)  05/15/21 200 lb (90.7 kg)    Health Maintenance Due  Topic Date Due   OPHTHALMOLOGY EXAM  Never done   TETANUS/TDAP  Never done   Zoster Vaccines- Shingrix (1 of 2) Never done   COLONOSCOPY (Pts 45-23yrs Insurance coverage will need to be confirmed)  Never done   MAMMOGRAM  Never done   DEXA SCAN  Never done    There are no preventive care reminders to display for this patient.   Lab Results  Component Value Date   TSH 2.460 06/21/2021   Lab Results  Component Value Date   WBC 9.9 06/21/2021   HGB 15.3 06/21/2021   HCT 46.7 (H) 06/21/2021   MCV 92 06/21/2021   PLT 191 06/21/2021   Lab Results  Component Value Date   NA 141 06/21/2021   K 4.7 06/21/2021   CO2 26 06/21/2021   GLUCOSE 99 06/21/2021   BUN 10 06/21/2021   CREATININE 0.91 06/21/2021   BILITOT 0.4 06/21/2021   ALKPHOS 116 06/21/2021   AST 16 06/21/2021   ALT 17 06/21/2021   PROT 6.3 06/21/2021   ALBUMIN 4.5 06/21/2021   CALCIUM 9.9 06/21/2021   EGFR 70 06/21/2021   Lab Results  Component Value Date   CHOL 190 06/21/2021   Lab Results  Component Value Date   HDL 49 06/21/2021   Lab Results  Component Value Date   LDLCALC 107 (H) 06/21/2021   Lab Results  Component Value Date   TRIG 199 (H) 06/21/2021   Lab Results  Component Value Date   CHOLHDL 3.9 06/21/2021   Lab Results  Component Value Date   HGBA1C 6.3 (H) 06/21/2021       Assessment & Plan:   Diagnoses and all orders for this visit: Acute cough -     POC COVID-19- negative COPD with exacerbation (HCC) -     triamcinolone acetonide (KENALOG-40) injection 80 mg -     amoxicillin-clavulanate (AUGMENTIN) 875-125 MG tablet; Take 1 tablet by mouth 2 (two)  times daily. Treat Copd with antibiotics and prednisone use inhalers. Duoneb given        Follow-up: No follow-ups on file.  An After Visit Summary was printed and given to the patient.  Reinaldo Meeker, MD [facility] [phone]

## 2021-07-31 ENCOUNTER — Other Ambulatory Visit: Payer: Self-pay

## 2021-07-31 MED ORDER — BENZONATATE 100 MG PO CAPS: 100.0000 mg | ORAL_CAPSULE | Freq: Three times a day (TID) | ORAL | 1 refills | Status: DC | PRN

## 2021-08-01 ENCOUNTER — Other Ambulatory Visit: Payer: Self-pay | Admitting: Legal Medicine

## 2021-08-01 ENCOUNTER — Telehealth: Payer: Self-pay

## 2021-08-01 MED ORDER — CLINDAMYCIN HCL 150 MG PO CAPS: 150.0000 mg | ORAL_CAPSULE | Freq: Three times a day (TID) | ORAL | 0 refills | Status: DC

## 2021-08-01 NOTE — Telephone Encounter (Signed)
Patient was informed.

## 2021-08-01 NOTE — Telephone Encounter (Signed)
Stop augmentin, I will call in cindamycin lp

## 2021-08-01 NOTE — Telephone Encounter (Signed)
Daughter calling pt has broke out in itchy rash. Believes this to be from antibiotic (augmentin). Pt has been given benadryl twice and this has helped. They are requesting change in antibiotic as she was on day 2 of meds.

## 2021-08-03 DIAGNOSIS — J441 Chronic obstructive pulmonary disease with (acute) exacerbation: Secondary | ICD-10-CM | POA: Insufficient documentation

## 2021-08-04 DIAGNOSIS — J189 Pneumonia, unspecified organism: Secondary | ICD-10-CM | POA: Insufficient documentation

## 2021-08-04 DIAGNOSIS — T7840XA Allergy, unspecified, initial encounter: Secondary | ICD-10-CM | POA: Insufficient documentation

## 2021-08-13 ENCOUNTER — Other Ambulatory Visit: Payer: Self-pay | Admitting: Legal Medicine

## 2021-08-14 ENCOUNTER — Encounter: Payer: Self-pay | Admitting: Physician Assistant

## 2021-09-04 ENCOUNTER — Inpatient Hospital Stay: Payer: Medicare Other | Admitting: Legal Medicine

## 2021-09-12 ENCOUNTER — Ambulatory Visit (INDEPENDENT_AMBULATORY_CARE_PROVIDER_SITE_OTHER): Payer: 59 | Admitting: Physician Assistant

## 2021-09-12 ENCOUNTER — Encounter: Payer: Self-pay | Admitting: Physician Assistant

## 2021-09-12 VITALS — BP 120/80 | HR 95 | Temp 98.0°F | Ht 63.0 in | Wt 194.6 lb

## 2021-09-12 DIAGNOSIS — R3121 Asymptomatic microscopic hematuria: Secondary | ICD-10-CM | POA: Diagnosis not present

## 2021-09-12 DIAGNOSIS — J441 Chronic obstructive pulmonary disease with (acute) exacerbation: Secondary | ICD-10-CM | POA: Diagnosis not present

## 2021-09-12 DIAGNOSIS — R059 Cough, unspecified: Secondary | ICD-10-CM | POA: Diagnosis not present

## 2021-09-12 DIAGNOSIS — R339 Retention of urine, unspecified: Secondary | ICD-10-CM

## 2021-09-12 LAB — POC COVID19 BINAXNOW: SARS Coronavirus 2 Ag: NEGATIVE

## 2021-09-12 LAB — POCT URINALYSIS DIP (CLINITEK)
Bilirubin, UA: NEGATIVE
Glucose, UA: NEGATIVE mg/dL
Ketones, POC UA: NEGATIVE mg/dL
Leukocytes, UA: NEGATIVE
Nitrite, UA: NEGATIVE
POC PROTEIN,UA: NEGATIVE
Spec Grav, UA: 1.015 (ref 1.010–1.025)
Urobilinogen, UA: 0.2 E.U./dL
pH, UA: 6 (ref 5.0–8.0)

## 2021-09-12 MED ORDER — DOXYCYCLINE HYCLATE 100 MG PO TABS: 100.0000 mg | ORAL_TABLET | Freq: Two times a day (BID) | ORAL | 0 refills | Status: DC

## 2021-09-12 MED ORDER — BENZONATATE 100 MG PO CAPS: 100.0000 mg | ORAL_CAPSULE | Freq: Three times a day (TID) | ORAL | 1 refills | Status: DC | PRN

## 2021-09-12 NOTE — Progress Notes (Signed)
Acute Office Visit  Subjective:    Patient ID: Kaitlyn Rosales, female    DOB: July 06, 1956, 66 y.o.   MRN: [id number]  Chief Complaint  Patient presents with   Urinary Retention   Back Pain    HPI: Patient is in today for complaints of problems urinating - states has been a problems for over a month and upon further questioning she has not been taking Gemtesa at all because of cost - states her home health nurse was supposed to help get covered but she has not heard back about that - I will reach out to our CCM pharmacist She is not having dysuria or urgency  Pt complains of productive cough - she was admitted in December for pneumonia (but she never has done a follow up visit) - states she did feel some better after her admission but in the past week her cough has returned  She requests refill of tessalon perles which help her cough She does use oxygen at night  Past Medical History:  Diagnosis Date   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 Added automatically from request for surgery [phone]   Chronic respiratory failure with hypoxia ([place]) 01/09/2021   Gastroesophageal reflux disease without esophagitis 05/05/2017   History of MI (myocardial infarction) 03/07/2021   Formatting of this note might be different from the original. living in [place], never cathed (early 40s ~) patient reports MD said "light heart attack"   Mixed hyperlipidemia 10/29/2017   OSA (obstructive sleep apnea) 01/09/2021   Posttraumatic stress disorder 10/15/2016    Past Surgical History:  Procedure Laterality Date   APPENDECTOMY  1970   CERVICAL SPINE SURGERY  2020   CHOLECYSTECTOMY  1970   ELBOW SURGERY Right 1990   PARTIAL HYSTERECTOMY  1990    Family History  Problem Relation Age of Onset    Dementia Mother    Parkinson's disease Mother    Cirrhosis Father    Osteoarthritis Sister    Breast cancer Sister    COPD Sister    COPD Brother     Social History   Socioeconomic History   Marital status: Single    Spouse name: Not on file   Number of children: 3   Years of education: Not on file   Highest education level: High school graduate  Occupational History   Occupation: Disabled  Tobacco Use   Smoking status: Every Day    Packs/day: 0.50    Types: Cigarettes   Smokeless tobacco: Never  Vaping Use   Vaping Use: Never used  Substance and Sexual Activity   Alcohol use: Not Currently   Drug use: Never   Sexual activity: Yes    Partners: Male  Other Topics Concern   Not on file  Social History Narrative   Not on file   Social Determinants of Health   Financial Resource Strain: Not on file  Food Insecurity: Not on file  Transportation Needs: Not on file  Physical Activity: Not on file  Stress: Not on file  Social Connections: Not on file  Intimate Partner Violence: Not on file    Outpatient Medications Prior to Visit  Medication Sig Dispense Refill   albuterol (PROVENTIL) (2.5 MG/3ML) 0.083% nebulizer solution USE 1 VIAL PER NEBULIZER EVERY 6 HOURS AS NEEDED FOR WHEEZING  75 mL 2   atorvastatin (LIPITOR) 40 MG tablet Take 1 tablet by mouth daily.     Blood Glucose Monitoring Suppl (ONE TOUCH ULTRA 2) w/Device KIT 1 each by Does not apply route daily. 1 kit 0   Cholecalciferol 25 MCG (1000 UT) tablet Take by mouth.     clindamycin (CLEOCIN) 150 MG capsule Take 1 capsule (150 mg total) by mouth 3 (three) times daily. 30 capsule 0   cyclobenzaprine (FLEXERIL) 5 MG tablet Take 1 tablet by mouth 3 (three) times daily as needed.     dextromethorphan-guaiFENesin (ROBITUSSIN-DM) 10-100 MG/5ML liquid Take 5 mLs by mouth daily as needed.     diazepam (VALIUM) 5 MG tablet Take one tablet daily for anxiety and panic attacks as needed     ferrous sulfate 325 (65 FE) MG  tablet Take by mouth.     glucose blood test strip Use as instructed 100 each 10   Lactobacillus (ACIDOPHILUS) 100 MG CAPS Take by mouth.     lisinopril (ZESTRIL) 10 MG tablet TAKE ONE TABLET DAILY IN THE MORNING FOR BLOOD PRESSURE     meclizine (ANTIVERT) 25 MG tablet Take by mouth.     ondansetron (ZOFRAN) 8 MG tablet Take by mouth.     OXYGEN 2 L by Does not apply route at bedtime.     pantoprazole (PROTONIX) 40 MG tablet TAKE 1 TABLET BY MOUTH ONCE DAILY FOR STOMACH/REFLUX     Tiotropium Bromide-Olodaterol 2.5-2.5 MCG/ACT AERS Inhale into the lungs.     Vibegron (GEMTESA) 75 MG TABS Take 75 mg by mouth daily. 90 tablet 0   Vitamin A 2400 MCG (8000 UT) CAPS Take by mouth.     vitamin C (ASCORBIC ACID) 500 MG tablet Take 500 mg by mouth daily.     vitamin E 180 MG (400 UNITS) capsule Take 400 Units by mouth daily.     Vitamin E 268 MG (400 UNIT) CAPS Take 400 Units by mouth daily.     benzonatate (TESSALON) 100 MG capsule Take 1 capsule (100 mg total) by mouth 3 (three) times daily as needed. 30 capsule 1   No facility-administered medications prior to visit.    Allergies  Allergen Reactions   Amoxicillin-Pot Clavulanate Hives    Took benadryl and symptoms resolved in 2 days. No angioedema.   Levofloxacin Itching and Rash   Ceftriaxone Other (See Comments)    coughing, difficulty swallowing, rash - unclear if caused by ceftriaxone - has tolerated cefazolin and cephalexin previously   Nitrofurantoin Other (See Comments), Swelling and Hives    Required hospitalization   Oxycodone-Acetaminophen Other (See Comments)    Auditory hallucinations, unable to sleep   Tape Dermatitis    Paper tape is fine   Sulfamethoxazole-Trimethoprim Rash    Review of Systems CONSTITUTIONAL: Negative for chills, fatigue, fever, unintentional weight gain and unintentional weight loss.  E/N/T: Negative for ear pain, nasal congestion and sore throat.  CARDIOVASCULAR: Negative for chest pain, dizziness,  palpitations and pedal edema.  RESPIRATORY: see HPI GASTROINTESTINAL: Negative for abdominal pain, acid reflux symptoms, constipation, diarrhea, nausea and vomiting.  GU - see HPI      Objective:    Physical Exam  BP 120/80    Pulse 95    Temp 98 F (36.7 C)    Ht $R'5\' 3"'HY$  (1.6 m)    Wt 194 lb 9.6 oz (88.3 kg)    LMP  (LMP Unknown)    SpO2 94%    BMI 34.47 kg/m  Wt Readings from Last 3 Encounters:  09/12/21 194 lb 9.6 oz (88.3 kg)  07/30/21 202 lb (91.6 kg)  06/21/21 203 lb (92.1 kg)  PHYSICAL EXAM:   VS: BP 120/80    Pulse 95    Temp 98 F (36.7 C)    Ht $R'5\' 3"'vI$  (1.6 m)    Wt 194 lb 9.6 oz (88.3 kg)    LMP  (LMP Unknown)    SpO2 94%    BMI 34.47 kg/m   GEN: Well nourished, well developed, in no acute distress  HEENT: normal external ears and nose - normal external auditory canals and TMS - - Lips, Teeth and Gums - normal  Oropharynx - erythema/pnd Cardiac: RRR; no murmurs, rubs, or gallops,no edema - no significant varicosities Respiratory:  scattered rhonchi noted  Psych: euthymic mood, appropriate affect and demeanor  Office Visit on 09/12/2021  Component Date Value Ref Range Status   SARS Coronavirus 2 Ag 09/12/2021 Negative  Negative Final   Color, UA 09/12/2021 yellow  yellow Final   Clarity, UA 09/12/2021 clear  clear Final   Glucose, UA 09/12/2021 negative  negative mg/dL Final   Bilirubin, UA 09/12/2021 negative  negative Final   Ketones, POC UA 09/12/2021 negative  negative mg/dL Final   Spec Grav, UA 09/12/2021 1.015  1.010 - 1.025 Final   Blood, UA 09/12/2021 trace-lysed (A)  negative Final   pH, UA 09/12/2021 6.0  5.0 - 8.0 Final   POC PROTEIN,UA 09/12/2021 negative  negative, trace Final   Urobilinogen, UA 09/12/2021 0.2  0.2 or 1.0 E.U./dL Final   Nitrite, UA 09/12/2021 Negative  Negative Final   Leukocytes, UA 09/12/2021 Negative  Negative Final    Health Maintenance Due  Topic Date Due   OPHTHALMOLOGY EXAM  Never done   TETANUS/TDAP  Never done    Zoster Vaccines- Shingrix (1 of 2) Never done   COLONOSCOPY (Pts 45-79yrs Insurance coverage will need to be confirmed)  Never done   MAMMOGRAM  Never done   DEXA SCAN  Never done    There are no preventive care reminders to display for this patient.   Lab Results  Component Value Date   TSH 2.460 06/21/2021   Lab Results  Component Value Date   WBC 9.9 06/21/2021   HGB 15.3 06/21/2021   HCT 46.7 (H) 06/21/2021   MCV 92 06/21/2021   PLT 191 06/21/2021   Lab Results  Component Value Date   NA 141 06/21/2021   K 4.7 06/21/2021   CO2 26 06/21/2021   GLUCOSE 99 06/21/2021   BUN 10 06/21/2021   CREATININE 0.91 06/21/2021   BILITOT 0.4 06/21/2021   ALKPHOS 116 06/21/2021   AST 16 06/21/2021   ALT 17 06/21/2021   PROT 6.3 06/21/2021   ALBUMIN 4.5 06/21/2021   CALCIUM 9.9 06/21/2021   EGFR 70 06/21/2021   Lab Results  Component Value Date   CHOL 190 06/21/2021   Lab Results  Component Value Date   HDL 49 06/21/2021   Lab Results  Component Value Date   LDLCALC 107 (H) 06/21/2021   Lab Results  Component Value Date   TRIG 199 (H) 06/21/2021   Lab Results  Component Value Date   CHOLHDL 3.9 06/21/2021   Lab Results  Component Value Date   HGBA1C 6.3 (H) 06/21/2021       Assessment & Plan:   Problem List Items Addressed This Visit   None Visit Diagnoses     COPD with exacerbation (  [place])    -  Primary   Relevant Medications   dextromethorphan-guaiFENesin (ROBITUSSIN-DM) 10-100 MG/5ML liquid   benzonatate (TESSALON) 100 MG capsule   doxycycline (VIBRA-TABS) 100 MG tablet   Cough, unspecified type       Relevant Orders   POC COVID-19 (Completed)   Urinary retention       Relevant Orders   Urine Culture   POCT URINALYSIS DIP (CLINITEK) (Completed) Samples of gemtesa given   Asymptomatic microscopic hematuria      Urine culture pending    Meds ordered this encounter  Medications   benzonatate (TESSALON) 100 MG capsule    Sig: Take 1 capsule  (100 mg total) by mouth 3 (three) times daily as needed.    Dispense:  30 capsule    Refill:  1    Order Specific Question:   Supervising Provider    Answer:   Shelton Silvas   doxycycline (VIBRA-TABS) 100 MG tablet    Sig: Take 1 tablet (100 mg total) by mouth 2 (two) times daily.    Dispense:  20 tablet    Refill:  0    Order Specific Question:   Supervising Provider    AnswerShelton Silvas    Orders Placed This Encounter  Procedures   Urine Culture   POC COVID-19   POCT URINALYSIS DIP (CLINITEK)     Follow-up: Return in about 3 weeks (around 10/03/2021) for chronic fasting 40 min.  An After Visit Summary was printed and given to the patient.  Yetta Flock [facility] [phone]

## 2021-09-13 ENCOUNTER — Other Ambulatory Visit: Payer: Self-pay | Admitting: Physician Assistant

## 2021-09-13 DIAGNOSIS — N183 Chronic kidney disease, stage 3 unspecified: Secondary | ICD-10-CM

## 2021-09-13 DIAGNOSIS — I129 Hypertensive chronic kidney disease with stage 1 through stage 4 chronic kidney disease, or unspecified chronic kidney disease: Secondary | ICD-10-CM

## 2021-09-13 DIAGNOSIS — J410 Simple chronic bronchitis: Secondary | ICD-10-CM

## 2021-09-13 DIAGNOSIS — J441 Chronic obstructive pulmonary disease with (acute) exacerbation: Secondary | ICD-10-CM

## 2021-09-16 ENCOUNTER — Telehealth: Payer: Self-pay

## 2021-09-16 NOTE — Telephone Encounter (Signed)
Patient complains of nausea and worsening of acid reflux since starting gemtesa on 2/2. Does not want to continue.   [name] 09/16/21 4:13 PM

## 2021-09-17 ENCOUNTER — Telehealth: Payer: Self-pay | Admitting: *Deleted

## 2021-09-17 ENCOUNTER — Inpatient Hospital Stay: Payer: 59 | Admitting: Physician Assistant

## 2021-09-17 ENCOUNTER — Telehealth: Payer: Self-pay | Admitting: Physician Assistant

## 2021-09-17 NOTE — Chronic Care Management (AMB) (Signed)
Chronic Care Management   Note  09/17/2021 Name: Kaitlyn Rosales MRN: [id number] DOB: 09/21/1955  Kaitlyn Rosales is a 66 y.o. year old female who is a primary care patient of Marge Duncans, Hershal Coria. I reached out to [facility] by phone today in response to a referral sent by Ms. Ralene Orlick's PCP.  Ms. Lukasik was given information about Chronic Care Management services today including:  CCM service includes personalized support from designated clinical staff supervised by her physician, including individualized plan of care and coordination with other care providers 24/7 contact phone numbers for assistance for urgent and routine care needs. Service will only be billed when office clinical staff spend 20 minutes or more in a month to coordinate care. Only one practitioner may furnish and bill the service in a calendar month. The patient may stop CCM services at any time (effective at the end of the month) by phone call to the office staff. The patient is responsible for co-pay (up to 20% after annual deductible is met) if co-pay is required by the individual health plan.   Patient agreed to services and verbal consent obtained.   Follow up plan: Telephone appointment with care management team member scheduled for:10/03/21  [place] [facility]  Direct Dial: [phone]

## 2021-09-17 NOTE — Telephone Encounter (Signed)
Patient aware. She will continue antibiotic.   [name], [place] 09/17/21 9:35 AM

## 2021-09-17 NOTE — Telephone Encounter (Signed)
[place].   [name], [place] 09/17/21 8:52 AM

## 2021-09-17 NOTE — Chronic Care Management (AMB) (Signed)
°  Chronic Care Management   Note  09/17/2021 Name: Haniyah Maciolek MRN: [id number] DOB: 04/05/1956  Brayden Betters is a 66 y.o. year old female who is a primary care patient of Marge Duncans, Hershal Coria. I reached out to [facility] by phone today in response to a referral sent by Ms. Elvera Maria Canevari's PCP, Marge Duncans, [name].   Ms. Apple was given information about Chronic Care Management services today including:  CCM service includes personalized support from designated clinical staff supervised by her physician, including individualized plan of care and coordination with other care providers 24/7 contact phone numbers for assistance for urgent and routine care needs. Service will only be billed when office clinical staff spend 20 minutes or more in a month to coordinate care. Only one practitioner may furnish and bill the service in a calendar month. The patient may stop CCM services at any time (effective at the end of the month) by phone call to the office staff.   Patient agreed to services and verbal consent obtained.   Follow up plan:   Tatjana Secretary/administrator

## 2021-09-17 NOTE — Telephone Encounter (Signed)
It could be the antibiotic -- however she has a COPD flare and is allergic to multiple antibiotics -- if her symptoms are mild would continue to take and if she wants to take benadryl with it that is fine (would not think that would help stomach cramps though) --- try again with food

## 2021-09-17 NOTE — Telephone Encounter (Signed)
Pt was given this medication months ago by Dr Henrene Pastor and supposedly been taking for several months I only gave her samples on 2/2 and she requested help getting medication through pharmacy program So please have patient explain symptoms again - and when they started because this is not new med

## 2021-09-17 NOTE — Telephone Encounter (Signed)
Patient no longer believes it is gemtesa. Made me aware during call she has not taken gemtesa since Friday. But symptoms persist therefore she believes it is antibiotic making her feel bad. Symptoms include: nausea, abd pain, and feels she cannot eat. Denies diarrhea. She did state when she had this medication before she was in the hospital and they administered benadryl with it. This morning she tried taking antibiotic with breakfast and symptoms continued.   [name], [place] 09/17/21 8:58 AM

## 2021-09-24 ENCOUNTER — Ambulatory Visit: Payer: Medicare Other | Admitting: Physician Assistant

## 2021-09-30 ENCOUNTER — Telehealth: Payer: Self-pay

## 2021-09-30 NOTE — Chronic Care Management (AMB) (Signed)
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   Recent office visits:  09/12/21 Marge Duncans [name]. Seen for COPD and Urinary Retention. Started on Doxycycline Hyclate 156m 2 times daily.   08/01/21 Orders Only. PReinaldo MeekerMD. D/C Augmentin 875-1275mand Started on Clindamycin 15013m  07/30/21 PerReinaldo Meeker. Seen for Headache, COPD and Wheezing. Started on Augmentin 875-125m17m 06/21/21 DaviMarge DuncansC. Seen for HTN and CKD. Referral to Gastroenterology. Started on Doxycycline Hyclate 100mg43m10/5/22 DavisMarge Duncans. Seen for urinary retention. Started on Ciprofloxacin HCI 250mg 35mmes daily and Vibegron 75mg d20m.   04/09/21 Perry, Reinaldo Meekeren for Urinary Urgency. No med changes.   Recent consult visits:  07/09/21 (Pulmonology) Ejaz, MCharlaine Daltonen for COPD. No med changes.   06/28/21 (Pulmonology) Orders Only. Stiolot 2.5mcg in71mer   Hospital visits:  Medication Reconciliation was completed by comparing discharge summary, patients EMR and Pharmacy list, and upon discussion with patient.  Admitted to the hospital on 08/03/21 due to COPD. Discharge date was 08/03/21. [facility] -started Dextromethorphan-Guaifenesin 10-100mg/5Ml48moxycycline Hyclate 100mg -Fam48mine 20mg -Meto92mol Tartrate 25mg -Predn51me 5mg  Medicat81m Changes at Hospital Discharge: -Changed Pantoprazole 40mg daily.  54mcations Discontinued at Hospital Discharge: -Stopped Budesonide 0.25mg Nebulizer9muticasone  Propionate-Salmeterol 50mcg inhaler -78muse Ellipta 62.5mcg inhaler -Ip32mropium-Albuterol 0.5mg nebulizer -Li70mopril 10mg -Onetouch tes64mrips Onetouch meter -Trelegy Ellipta 100-62.5-25mcg inhaler ??  -All other medications will remain the same.    Medications: Outpatient Encounter Medications as of 09/30/2021  Medication Sig   albuterol (PROVENTIL) (2.5 MG/3ML) 0.083% nebulizer solution USE 1 VIAL PER NEBULIZER EVERY 6 HOURS AS NEEDED FOR WHEEZING   atorvastatin (LIPITOR) 40 MG tablet Take 1 tablet by mouth daily.   benzonatate (TESSALON) 100 MG capsule Take 1 capsule (100 mg total) by mouth 3 (three) times daily as needed.   Blood Glucose Monitoring Suppl (ONE TOUCH ULTRA 2) w/Device KIT 1 each by Does not apply route daily.   Cholecalciferol 25 MCG (1000 UT) tablet Take by mouth.   clindamycin (CLEOCIN) 150 MG capsule Take 1 capsule (150 mg total) by mouth 3 (three) times daily.   cyclobenzaprine (FLEXERIL) 5 MG tablet Take 1 tablet by mouth 3 (three) times daily as needed.   dextromethorphan-guaiFENesin (ROBITUSSIN-DM) 10-100 MG/5ML liquid Take 5 mLs by mouth daily as needed.   diazepam (VALIUM) 5 MG tablet Take one tablet daily for anxiety and panic attacks as needed   doxycycline (VIBRA-TABS) 100 MG tablet Take 1 tablet (100 mg total) by mouth 2 (two) times daily.   ferrous sulfate 325 (65 FE) MG tablet Take by mouth.   glucose blood test strip Use as instructed   Lactobacillus (ACIDOPHILUS) 100 MG CAPS Take by mouth.   lisinopril (ZESTRIL) 10 MG tablet TAKE ONE TABLET DAILY IN THE MORNING FOR BLOOD PRESSURE   meclizine (ANTIVERT) 25 MG tablet Take by mouth.   ondansetron (ZOFRAN) 8 MG tablet Take by mouth.   OXYGEN 2 L by Does not apply route at bedtime.   pantoprazole (PROTONIX) 40 MG tablet TAKE 1 TABLET BY MOUTH ONCE DAILY FOR STOMACH/REFLUX  Tiotropium Bromide-Olodaterol 2.5-2.5 MCG/ACT AERS Inhale into the lungs.   Vibegron (GEMTESA) 75 MG TABS Take 75 mg by mouth  daily.   Vitamin A 2400 MCG (8000 UT) CAPS Take by mouth.   vitamin C (ASCORBIC ACID) 500 MG tablet Take 500 mg by mouth daily.   vitamin E 180 MG (400 UNITS) capsule Take 400 Units by mouth daily.   Vitamin E 268 MG (400 UNIT) CAPS Take 400 Units by mouth daily.   No facility-administered encounter medications on file as of 09/30/2021.     Lab Results  Component Value Date/Time   HGBA1C 6.3 (H) 06/21/2021 08:57 AM   HGBA1C 6.1 (H) 03/07/2021 11:41 AM     BP Readings from Last 3 Encounters:  09/12/21 120/80  07/30/21 110/60  06/21/21 120/72    Patient Questions:   Have you seen any other providers since your last visit with PCP? Pt denied seeing any other providers  Any changes in your medications or health? Pt denied any changes   Any side effects from any medications? Pt has a lot of drug allergies but no side effects on what she is taking now   Do you have an symptoms or problems not managed by your medications? No  Any concerns about your health right now? Yes, has COPD and has to watch her breathing very closely   Has your provider asked that you check blood pressure, blood sugar, or follow special diet at home? Yes, pt checks her BP but her cuff is not currently working   Do you get any type of exercise on a regular basis? Yes, pt does a lot of house and yard work.   Can you think of a goal you would like to reach for your health? Yes, she just wants to stay healthy and make it to 79.  Do you have any problems getting your medications? No  Is there anything that you would like to discuss during the appointment? No   Fianna Galant was reminded to have all medications, supplements and any blood glucose and blood pressure readings available for review with [place] Constable Pharm. D, at her telephone visit on 10/07/21 at 8:30am .    Star Rating Drugs: Switched pharmacies so no fill date available. Pt reported last refill  Medication:  Last Fill: Day Supply Lisinopril    08/29/21 90ds Atorvastatin   08/29/21 90ds   Care Gaps: Last annual wellness visit?None noted  Colonoscopy: Never done Mammogram: Ordered on 06/21/21 Dexa Scan: Ordered on 06/21/21 Last eye exam / retinopathy screening? Never done Last diabetic foot exam? 03/07/21  Elray Mcgregor, [place] Pharmacist Assistant  [phone]

## 2021-10-02 ENCOUNTER — Other Ambulatory Visit: Payer: Self-pay

## 2021-10-02 ENCOUNTER — Encounter: Payer: Self-pay | Admitting: Physician Assistant

## 2021-10-02 ENCOUNTER — Ambulatory Visit (INDEPENDENT_AMBULATORY_CARE_PROVIDER_SITE_OTHER): Payer: 59 | Admitting: Physician Assistant

## 2021-10-02 VITALS — BP 112/64 | HR 100 | Temp 97.3°F | Resp 20 | Ht 63.0 in | Wt 194.8 lb

## 2021-10-02 DIAGNOSIS — N183 Chronic kidney disease, stage 3 unspecified: Secondary | ICD-10-CM

## 2021-10-02 DIAGNOSIS — R7303 Prediabetes: Secondary | ICD-10-CM | POA: Diagnosis not present

## 2021-10-02 DIAGNOSIS — K219 Gastro-esophageal reflux disease without esophagitis: Secondary | ICD-10-CM | POA: Diagnosis not present

## 2021-10-02 DIAGNOSIS — I129 Hypertensive chronic kidney disease with stage 1 through stage 4 chronic kidney disease, or unspecified chronic kidney disease: Secondary | ICD-10-CM

## 2021-10-02 DIAGNOSIS — E782 Mixed hyperlipidemia: Secondary | ICD-10-CM | POA: Diagnosis not present

## 2021-10-02 DIAGNOSIS — N3281 Overactive bladder: Secondary | ICD-10-CM

## 2021-10-02 DIAGNOSIS — Z1211 Encounter for screening for malignant neoplasm of colon: Secondary | ICD-10-CM

## 2021-10-02 DIAGNOSIS — E559 Vitamin D deficiency, unspecified: Secondary | ICD-10-CM

## 2021-10-02 DIAGNOSIS — J441 Chronic obstructive pulmonary disease with (acute) exacerbation: Secondary | ICD-10-CM

## 2021-10-02 MED ORDER — ALBUTEROL SULFATE (2.5 MG/3ML) 0.083% IN NEBU: INHALATION_SOLUTION | RESPIRATORY_TRACT | 2 refills | Status: DC

## 2021-10-02 MED ORDER — TIOTROPIUM BROMIDE-OLODATEROL 2.5-2.5 MCG/ACT IN AERS: INHALATION_SPRAY | RESPIRATORY_TRACT | 5 refills | Status: DC

## 2021-10-02 NOTE — Progress Notes (Signed)
Subjective:  Patient ID: Kaitlyn Rosales, female    DOB: 09/17/1955  Age: 66 y.o. MRN: [id number]  Chief Complaint  Patient presents with   COPD   Hypertension    COPD Her past medical history is significant for COPD.  Hypertension   Pt presents for follow up of hypertension. The patient is tolerating the medication well without side effects. Compliance with treatment has been good; including taking medication as directed , maintains a healthy diet and regular exercise regimen , and following up as directed. Currently taking zestril $RemoveBefore'10mg'VSANfNeOAyTHD$  qd  Mixed hyperlipidemia  Pt presents with hyperlipidemia. ; The patient is compliant with medications, maintains a low cholesterol diet , follows up as directed , and maintains an exercise regimen . The patient denies experiencing any hypercholesterolemia related symptoms. Currently taking lipitor $RemoveBefore'40mg'YsIvaqosSaiSl$  qd  Pt with history of COPD - currently uses albuterol in nebulizer and stiolto - she states these medications work well for her and requests refill of meds Her breathing is currently stable and has no acute symptoms She does use oxygen at night  Pt with history of prediabetes --- it was noted in chart she was diabetic however pt states she has never had that diagnosis and has always been borderline diabetic with her last hgb a1c at 6.3 - she has never been on any medications for diabetes She does check her glucose and it ranges in the 100s  Pt with history of anxiety - uses valium as needed which controls symptoms well  Pt with history of GERD - uses protonix $RemoveBefor'40mg'JmihdplOTKqm$  qd which controls her symptoms  Pt states that she has a history of overactive bladder - she is currently on gemtesa and that medication is working well for her  Pt is due for colonoscopy and she has contacted the office for an appointment   She has been scheduled for mammogram and dexa scan - appt on 11/01/21   Current Outpatient Medications on File Prior to Visit  Medication Sig  Dispense Refill   atorvastatin (LIPITOR) 40 MG tablet Take 1 tablet by mouth daily.     benzonatate (TESSALON) 100 MG capsule Take 1 capsule (100 mg total) by mouth 3 (three) times daily as needed. 30 capsule 1   Blood Glucose Monitoring Suppl (ONE TOUCH ULTRA 2) w/Device KIT 1 each by Does not apply route daily. 1 kit 0   Cholecalciferol 25 MCG (1000 UT) tablet Take by mouth.     cyclobenzaprine (FLEXERIL) 5 MG tablet Take 1 tablet by mouth 3 (three) times daily as needed.     diazepam (VALIUM) 5 MG tablet Take one tablet daily for anxiety and panic attacks as needed     ferrous sulfate 325 (65 FE) MG tablet Take by mouth.     glucose blood test strip Use as instructed 100 each 10   lisinopril (ZESTRIL) 10 MG tablet TAKE ONE TABLET DAILY IN THE MORNING FOR BLOOD PRESSURE     meclizine (ANTIVERT) 25 MG tablet Take by mouth.     ondansetron (ZOFRAN) 8 MG tablet Take by mouth.     OXYGEN 2 L by Does not apply route at bedtime.     pantoprazole (PROTONIX) 40 MG tablet TAKE 1 TABLET BY MOUTH ONCE DAILY FOR STOMACH/REFLUX     Vibegron (GEMTESA) 75 MG TABS Take 75 mg by mouth daily. 90 tablet 0   No current facility-administered medications on file prior to visit.   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 Added automatically from request for surgery [id number]   Chronic respiratory failure with hypoxia ([place]) 01/09/2021   Gastroesophageal reflux disease without esophagitis 05/05/2017   History of MI (myocardial infarction) 03/07/2021   Formatting of this note might be different from the original. living in [place], never cathed (early 36s ~) patient reports MD said "light heart attack"   Mixed hyperlipidemia 10/29/2017   OSA (obstructive sleep apnea)  01/09/2021   Posttraumatic stress disorder 10/15/2016   Past Surgical History:  Procedure Laterality Date   [place]  2020   CHOLECYSTECTOMY  1970   ELBOW SURGERY Right 1990   PARTIAL HYSTERECTOMY  1990    Family History  Problem Relation Age of Onset   Dementia Mother    Parkinson's disease Mother    Cirrhosis Father    Osteoarthritis Sister    Breast cancer Sister    COPD Sister    COPD Brother    Social History   Socioeconomic History   Marital status: Single    Spouse name: Not on file   Number of children: 3   Years of education: Not on file   Highest education level: High school graduate  Occupational History   Occupation: Disabled  Tobacco Use   Smoking status: Every Day    Packs/day: 0.50    Types: Cigarettes   Smokeless tobacco: Never  Vaping Use   Vaping Use: Never used  Substance and Sexual Activity   Alcohol use: Not Currently   Drug use: Never   Sexual activity: Yes    Partners: Male  Other Topics Concern   Not on file  Social History Narrative   Not on file   Social Determinants of Health   Financial Resource Strain: Not on file  Food Insecurity: Not on file  Transportation Needs: Not on file  Physical Activity: Not on file  Stress: Not on file  Social Connections: Not on file   CONSTITUTIONAL: Negative for chills, fatigue, fever, unintentional weight gain and unintentional weight loss.  E/N/T: Negative for ear pain, nasal congestion and sore throat.  CARDIOVASCULAR: Negative for chest pain, dizziness, palpitations and pedal edema.  RESPIRATORY: Negative for recent cough and dyspnea.  GASTROINTESTINAL: Negative for abdominal pain, acid reflux symptoms, constipation, diarrhea, nausea and vomiting.  MSK: Negative for arthralgias and myalgias.  INTEGUMENTARY: Negative for rash.  NEUROLOGICAL: Negative for dizziness and headaches.  PSYCHIATRIC: Negative for sleep disturbance and to question depression screen.   Negative for depression, negative for anhedonia.      Objective:  PHYSICAL EXAM:   VS: BP 112/64    Pulse 100    Temp (!) 97.3 F (36.3 C)    Resp 20    Ht $R'5\' 3"'WV$  (1.6 m)    Wt 194 lb 12.8 oz (88.4 kg)    LMP  (LMP Unknown)    SpO2 93%    BMI 34.51 kg/m   GEN: Well nourished, well developed, in no acute distress  Cardiac: RRR; no murmurs, rubs, or gallops,no edema -  Respiratory:  normal respiratory rate and pattern with no distress - normal breath sounds with no rales, rhonchi, wheezes or rubs Skin: warm and dry, no rash  Neuro:  Alert and Oriented x 3, Strength and sensation are intact -  CN II-Xii grossly intact Psych: euthymic mood, appropriate affect and demeanor  Lab Results  Component Value Date   WBC 9.9 06/21/2021   HGB 15.3 06/21/2021   HCT 46.7 (H) 06/21/2021   PLT 191 06/21/2021   GLUCOSE 99 06/21/2021   CHOL 190 06/21/2021   TRIG 199 (H) 06/21/2021   HDL 49 06/21/2021   LDLCALC 107 (H) 06/21/2021   ALT 17 06/21/2021   AST 16 06/21/2021   NA 141 06/21/2021   K 4.7 06/21/2021   CL 101 06/21/2021   CREATININE 0.91 06/21/2021   BUN 10 06/21/2021   CO2 26 06/21/2021   TSH 2.460 06/21/2021   HGBA1C 6.3 (H) 06/21/2021      Assessment & Plan:   Problem List Items Addressed This Visit       Cardiovascular and Mediastinum   Benign hypertension with CKD (chronic kidney disease) stage III ([place]) - Primary   Relevant Orders   CBC with Differential/Platelet   Comprehensive metabolic panel   TSH Continue current meds     Respiratory   COPD (chronic obstructive pulmonary disease) (HCC)   Relevant Medications   doxycycline (VIBRA-TABS) 100 MG tablet Follow up with pulmonologist as directed     Digestive   Gastroesophageal reflux disease without esophagitis Continue meds     Other   Mixed hyperlipidemia   Relevant Orders   CBC with Differential/Platelet   Comprehensive metabolic panel   Lipid panel Continue meds and watch diet   Hypovitaminosis D    Relevant Orders   VITAMIN D 25 Hydroxy (Vit-D Deficiency, Fractures)   Prediabetes   Relevant Orders   Hemoglobin A1c Watch diet   Other Visit Diagnoses     Urinary retention       Relevant Medications   Vibegron (GEMTESA) 75 MG TABS                                .  Meds ordered this encounter  Medications   albuterol (PROVENTIL) (2.5 MG/3ML) 0.083% nebulizer solution    Sig: USE 1 VIAL PER NEBULIZER EVERY 6 HOURS AS NEEDED FOR WHEEZING    Dispense:  75 mL    Refill:  2    Order Specific Question:   Supervising Provider    Answer:   Shelton Silvas   Tiotropium Bromide-Olodaterol 2.5-2.5 MCG/ACT AERS    Sig: 2 inhalations once daily    Dispense:  1 each    Refill:  5    Order Specific Question:   Supervising Provider    AnswerShelton Silvas    Orders Placed This Encounter  Procedures   CBC with Differential/Platelet   Comprehensive metabolic panel   Lipid panel   Hemoglobin A1c   VITAMIN D 25 Hydroxy (Vit-D Deficiency, Fractures)     Follow-up: Return in about 3 months (around 12/30/2021) for chronic fasting follow up .  An After Visit Summary was printed and given to the patient.  Yetta Flock [facility] [phone]

## 2021-10-03 ENCOUNTER — Telehealth: Payer: 59

## 2021-10-03 ENCOUNTER — Telehealth: Payer: Self-pay | Admitting: *Deleted

## 2021-10-03 ENCOUNTER — Telehealth: Payer: Self-pay

## 2021-10-03 ENCOUNTER — Other Ambulatory Visit: Payer: Self-pay

## 2021-10-03 LAB — CBC WITH DIFFERENTIAL/PLATELET
Basophils Absolute: 0 10*3/uL (ref 0.0–0.2)
Basos: 0 %
EOS (ABSOLUTE): 0.2 10*3/uL (ref 0.0–0.4)
Eos: 1 %
Hematocrit: 45.4 % (ref 34.0–46.6)
Hemoglobin: 14.9 g/dL (ref 11.1–15.9)
Immature Grans (Abs): 0.1 10*3/uL (ref 0.0–0.1)
Immature Granulocytes: 1 %
Lymphocytes Absolute: 3.8 10*3/uL — ABNORMAL HIGH (ref 0.7–3.1)
Lymphs: 29 %
MCH: 30.5 pg (ref 26.6–33.0)
MCHC: 32.8 g/dL (ref 31.5–35.7)
MCV: 93 fL (ref 79–97)
Monocytes Absolute: 0.9 10*3/uL (ref 0.1–0.9)
Monocytes: 7 %
Neutrophils Absolute: 8.1 10*3/uL — ABNORMAL HIGH (ref 1.4–7.0)
Neutrophils: 62 %
Platelets: 221 10*3/uL (ref 150–450)
RBC: 4.88 x10E6/uL (ref 3.77–5.28)
RDW: 12.3 % (ref 11.7–15.4)
WBC: 13.1 10*3/uL — ABNORMAL HIGH (ref 3.4–10.8)

## 2021-10-03 LAB — HEMOGLOBIN A1C
Est. average glucose Bld gHb Est-mCnc: 137 mg/dL
Hgb A1c MFr Bld: 6.4 % — ABNORMAL HIGH (ref 4.8–5.6)

## 2021-10-03 LAB — LIPID PANEL
Chol/HDL Ratio: 3.1 ratio (ref 0.0–4.4)
Cholesterol, Total: 165 mg/dL (ref 100–199)
HDL: 53 mg/dL (ref >39)
LDL Chol Calc (NIH): 89 mg/dL (ref 0–99)
Triglycerides: 128 mg/dL (ref 0–149)
VLDL Cholesterol Cal: 23 mg/dL (ref 5–40)

## 2021-10-03 LAB — COMPREHENSIVE METABOLIC PANEL
ALT: 15 IU/L (ref 0–32)
AST: 19 IU/L (ref 0–40)
Albumin/Globulin Ratio: 2 (ref 1.2–2.2)
Albumin: 4.3 g/dL (ref 3.8–4.8)
Alkaline Phosphatase: 113 IU/L (ref 44–121)
BUN/Creatinine Ratio: 14 (ref 12–28)
BUN: 15 mg/dL (ref 8–27)
Bilirubin Total: 0.6 mg/dL (ref 0.0–1.2)
CO2: 25 mmol/L (ref 20–29)
Calcium: 9.7 mg/dL (ref 8.7–10.3)
Chloride: 98 mmol/L (ref 96–106)
Creatinine, Ser: 1.04 mg/dL — ABNORMAL HIGH (ref 0.57–1.00)
Globulin, Total: 2.2 g/dL (ref 1.5–4.5)
Glucose: 92 mg/dL (ref 70–99)
Potassium: 4.4 mmol/L (ref 3.5–5.2)
Sodium: 139 mmol/L (ref 134–144)
Total Protein: 6.5 g/dL (ref 6.0–8.5)
eGFR: 59 mL/min/{1.73_m2} — ABNORMAL LOW (ref >59)

## 2021-10-03 LAB — CARDIOVASCULAR RISK ASSESSMENT

## 2021-10-03 LAB — VITAMIN D 25 HYDROXY (VIT D DEFICIENCY, FRACTURES): Vit D, 25-Hydroxy: 50.2 ng/mL (ref 30.0–100.0)

## 2021-10-03 MED ORDER — DIAZEPAM 5 MG PO TABS: ORAL_TABLET | ORAL | 0 refills | Status: DC

## 2021-10-03 NOTE — Telephone Encounter (Signed)
Placed call to patient who states that she is not interested in CCM nursing services. Patient declined services   Tomasa Rand RN, BSN, CEN RN Case Manager - [facility] Mobile: [phone]

## 2021-10-03 NOTE — Chronic Care Management (AMB) (Signed)
°  Care Management   Note  10/03/2021 Name: Kaitlyn Rosales MRN: [id number] DOB: [birth year]  Kaitlyn Rosales is a 66 y.o. year old female who is a primary care patient of Marge Duncans, Hershal Coria and is actively engaged with the care management team. I reached out to [facility] by phone today to assist with re-scheduling an initial visit with the Pharmacist  Follow up plan: Patient declines further follow up and engagement by the care management team. Appropriate care team members and provider have been notified via electronic communication. The care management team is available to follow up with the patient after provider conversation with the patient regarding recommendation for care management engagement and subsequent re-referral to the care management team.   [place] [facility]  Direct Dial: [phone]

## 2021-10-04 ENCOUNTER — Other Ambulatory Visit: Payer: Self-pay | Admitting: Physician Assistant

## 2021-10-04 MED ORDER — DIAZEPAM 5 MG PO TABS: ORAL_TABLET | ORAL | 0 refills | Status: DC

## 2021-10-04 NOTE — Addendum Note (Signed)
Addended by: Alonna Minium on: 10/04/2021 09:39 AM   Modules accepted: Orders

## 2021-10-04 NOTE — Telephone Encounter (Signed)
Previous script was printed and not received by pharmacy. Patient requesting thi sbe resent.   Kaitlyn Rosales 10/04/21 9:38 AM

## 2021-10-07 ENCOUNTER — Telehealth: Payer: 59

## 2021-10-10 ENCOUNTER — Encounter: Payer: Self-pay | Admitting: Physician Assistant

## 2021-10-10 ENCOUNTER — Ambulatory Visit (INDEPENDENT_AMBULATORY_CARE_PROVIDER_SITE_OTHER): Payer: Medicare Other | Admitting: Physician Assistant

## 2021-10-10 VITALS — BP 120/72 | HR 99 | Temp 97.2°F | Ht 63.0 in | Wt 195.2 lb

## 2021-10-10 DIAGNOSIS — J441 Chronic obstructive pulmonary disease with (acute) exacerbation: Secondary | ICD-10-CM | POA: Diagnosis not present

## 2021-10-10 DIAGNOSIS — J069 Acute upper respiratory infection, unspecified: Secondary | ICD-10-CM | POA: Diagnosis not present

## 2021-10-10 LAB — POC COVID19 BINAXNOW: SARS Coronavirus 2 Ag: NEGATIVE

## 2021-10-10 MED ORDER — PREDNISONE 20 MG PO TABS: ORAL_TABLET | ORAL | 0 refills | Status: AC

## 2021-10-10 MED ORDER — AZITHROMYCIN 250 MG PO TABS: ORAL_TABLET | ORAL | 0 refills | Status: AC

## 2021-10-10 MED ORDER — TRIAMCINOLONE ACETONIDE 40 MG/ML IJ SUSP
80.0000 mg | Freq: Once | INTRAMUSCULAR | Status: AC
  Administered 2021-10-10: 80 mg via INTRAMUSCULAR

## 2021-10-10 NOTE — Progress Notes (Signed)
Acute Office Visit  Subjective:    Patient ID: Kaitlyn Rosales, female    DOB: 02-09-1956, 66 y.o.   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 Added automatically from request for surgery [id number]   Chronic respiratory failure with hypoxia ([place]) 01/09/2021   Gastroesophageal reflux disease without esophagitis 05/05/2017   History of MI (myocardial infarction) 03/07/2021   Formatting of this note might be different from the original. living in [place], never cathed (early 40s ~) patient reports MD said "light heart attack"   Mixed hyperlipidemia 10/29/2017   OSA (obstructive sleep apnea) 01/09/2021   Posttraumatic stress disorder 10/15/2016    Past Surgical History:  Procedure Laterality Date   APPENDECTOMY  1970   CERVICAL SPINE SURGERY  2020   CHOLECYSTECTOMY  1970   ELBOW SURGERY Right 1990   PARTIAL HYSTERECTOMY  1990    Family History  Problem Relation Age of Onset   Dementia Mother    Parkinson's disease Mother    Cirrhosis Father    Osteoarthritis Sister    Breast cancer Sister    COPD Sister    COPD Brother     Social History   Socioeconomic History   Marital status: Single    Spouse name: Not on file   Number of children: 3   Years of education: Not on file   Highest education level: High school graduate  Occupational History   Occupation: Disabled  Tobacco Use   Smoking status: Every Day    Packs/day: 0.50    Types: Cigarettes   Smokeless  tobacco: Never  Vaping Use   Vaping Use: Never used  Substance and Sexual Activity   Alcohol use: Not Currently   Drug use: Never   Sexual activity: Yes    Partners: Male  Other Topics Concern   Not on file  Social History Narrative   Not on file   Social Determinants of Health   Financial Resource Strain: Not on file  Food Insecurity: Not on file  Transportation Needs: Not on file  Physical Activity: Not on file  Stress: Not on file  Social Connections: Not on file  Intimate Partner Violence: Not on file    Outpatient Medications Prior to Visit  Medication Sig Dispense Refill   albuterol (PROVENTIL) (2.5 MG/3ML) 0.083% nebulizer solution USE 1 VIAL PER NEBULIZER EVERY 6 HOURS AS NEEDED FOR WHEEZING 75 mL 2   atorvastatin (LIPITOR) 40 MG tablet Take 1 tablet by mouth daily.     benzonatate (TESSALON) 100 MG capsule Take 1 capsule (100 mg total) by mouth 3 (three) times daily as needed. 30 capsule 1   Blood Glucose Monitoring Suppl (ONE TOUCH ULTRA 2) w/Device KIT 1 each by Does not apply route daily. 1 kit 0   Cholecalciferol 25 MCG (1000 UT) tablet Take by mouth.     cyclobenzaprine (FLEXERIL) 5 MG tablet Take 1 tablet by mouth 3 (three) times daily as needed.  diazepam (VALIUM) 5 MG tablet Take one tablet daily for anxiety and panic attacks as needed 30 tablet 0   ferrous sulfate 325 (65 FE) MG tablet Take by mouth.     glucose blood test strip Use as instructed 100 each 10   lisinopril (ZESTRIL) 10 MG tablet TAKE ONE TABLET DAILY IN THE MORNING FOR BLOOD PRESSURE     meclizine (ANTIVERT) 25 MG tablet Take by mouth.     ondansetron (ZOFRAN) 8 MG tablet Take by mouth.     OXYGEN 2 L by Does not apply route at bedtime.     pantoprazole (PROTONIX) 40 MG tablet TAKE 1 TABLET BY MOUTH ONCE DAILY FOR STOMACH/REFLUX     Tiotropium Bromide-Olodaterol 2.5-2.5 MCG/ACT AERS 2 inhalations once daily 1 each 5   Vibegron (GEMTESA) 75 MG TABS Take 75 mg by mouth daily. 90 tablet 0    No facility-administered medications prior to visit.    Allergies  Allergen Reactions   Amoxicillin-Pot Clavulanate Hives    Took benadryl and symptoms resolved in 2 days. No angioedema.   Levofloxacin Itching and Rash   Ceftriaxone Other (See Comments)    coughing, difficulty swallowing, rash - unclear if caused by ceftriaxone - has tolerated cefazolin and cephalexin previously   Nitrofurantoin Other (See Comments), Swelling and Hives    Required hospitalization   Oxycodone-Acetaminophen Other (See Comments)    Auditory hallucinations, unable to sleep   Tape Dermatitis    Paper tape is fine   Sulfamethoxazole-Trimethoprim Rash    Review of Systems CONSTITUTIONAL: Negative for chills, fatigue, fever, unintentional weight gain and unintentional weight loss.  E/N/T: see HPI CARDIOVASCULAR: Negative for chest pain, dizziness, palpitations and pedal edema.  RESPIRATORY: see HPI GASTROINTESTINAL: Negative for abdominal pain, acid reflux symptoms, constipation, diarrhea, nausea and vomiting.          Objective:  PHYSICAL EXAM:   VS: BP 120/72 (BP Location: Left Arm, Patient Position: Sitting, Cuff Size: Large)    Pulse 99    Temp (!) 97.2 F (36.2 C) (Temporal)    Ht $R'5\' 3"'Pu$  (1.6 m)    Wt 195 lb 3.2 oz (88.5 kg)    LMP  (LMP Unknown)    SpO2 92%    BMI 34.58 kg/m   GEN: Well nourished, well developed, in no acute distress  HEENT: normal external ears and nose - normal external auditory canals and TMS - - Lips, Teeth and Gums - normal  Oropharynx - mild erythema Cardiac: RRR; no murmurs, rubs, or gallops,no edema - Respiratory:  scattered rhonchi/wheezes noted Skin: warm and dry, no rash  Psych: euthymic mood, appropriate affect and demeanor  Office Visit on 10/10/2021  Component Date Value Ref Range Status   SARS Coronavirus 2 Ag 10/10/2021 Negative  Negative Final    Health Maintenance Due  Topic Date Due   OPHTHALMOLOGY EXAM  Never done   TETANUS/TDAP  Never done    DEXA SCAN  Never done    There are no preventive care reminders to display for this patient.   Lab Results  Component Value Date   TSH 2.460 06/21/2021   Lab Results  Component Value Date   WBC 13.1 (H) 10/02/2021   HGB 14.9 10/02/2021   HCT 45.4 10/02/2021   MCV 93 10/02/2021   PLT 221 10/02/2021   Lab Results  Component Value Date   NA 139 10/02/2021   K 4.4 10/02/2021   CO2 25 10/02/2021   GLUCOSE 92 10/02/2021   BUN  15 10/02/2021   CREATININE 1.04 (H) 10/02/2021   BILITOT 0.6 10/02/2021   ALKPHOS 113 10/02/2021   AST 19 10/02/2021   ALT 15 10/02/2021   PROT 6.5 10/02/2021   ALBUMIN 4.3 10/02/2021   CALCIUM 9.7 10/02/2021   EGFR 59 (L) 10/02/2021   Lab Results  Component Value Date   CHOL 165 10/02/2021   Lab Results  Component Value Date   HDL 53 10/02/2021   Lab Results  Component Value Date   LDLCALC 89 10/02/2021   Lab Results  Component Value Date   TRIG 128 10/02/2021   Lab Results  Component Value Date   CHOLHDL 3.1 10/02/2021   Lab Results  Component Value Date   HGBA1C 6.4 (H) 10/02/2021       Assessment & Plan:   Problem List Items Addressed This Visit       Respiratory   COPD with acute exacerbation (HCC)   Relevant Medications   triamcinolone acetonide (KENALOG-40) injection 80 mg (Start on 10/10/2021 12:00 PM)   azithromycin (ZITHROMAX) 250 MG tablet   predniSONE (DELTASONE) 20 MG tablet   Other Visit Diagnoses     Acute upper respiratory infection    -  Primary   Relevant Medications   azithromycin (ZITHROMAX) 250 MG tablet   Other Relevant Orders   POC COVID-19 BinaxNow (Completed)      Meds ordered this encounter  Medications   triamcinolone acetonide (KENALOG-40) injection 80 mg   azithromycin (ZITHROMAX) 250 MG tablet    Sig: Take 2 tablets on day 1, then 1 tablet daily on days 2 through 5    Dispense:  6 tablet    Refill:  0    Order Specific Question:   Supervising Provider    Answer:   Rochel Brome  [[id number]]   predniSONE (DELTASONE) 20 MG tablet    Sig: Take 3 tablets (60 mg total) by mouth daily with breakfast for 3 days, THEN 2 tablets (40 mg total) daily with breakfast for 3 days, THEN 1 tablet (20 mg total) daily with breakfast for 3 days.    Dispense:  18 tablet    Refill:  0    Order Specific Question:   Supervising Provider    AnswerShelton Silvas    Orders Placed This Encounter  Procedures   POC COVID-19 BinaxNow     Follow-up: Return if symptoms worsen or fail to improve.  An After Visit Summary was printed and given to the patient.  Yetta Flock [facility] [phone]

## 2021-10-24 ENCOUNTER — Telehealth: Payer: Self-pay

## 2021-10-24 NOTE — Telephone Encounter (Signed)
General/Other - muscle cramps ? ?Patient has been having cramping in legs for past week. She has not used OTC medication as it is not painful but is sore. She has been drinking 4 bottles of water a day. She is questioning if there is anything she can do to help these cramps.  ? ?[name] 10/24/21 8:41 AM ? ? ?

## 2021-10-24 NOTE — Telephone Encounter (Signed)
Patient aware and will try this.  ? ?Kaitlyn Rosales 10/24/21 9:13 AM ? ?

## 2021-10-28 ENCOUNTER — Ambulatory Visit (INDEPENDENT_AMBULATORY_CARE_PROVIDER_SITE_OTHER): Payer: Medicare Other | Admitting: Physician Assistant

## 2021-10-28 ENCOUNTER — Encounter: Payer: Self-pay | Admitting: Physician Assistant

## 2021-10-28 ENCOUNTER — Other Ambulatory Visit: Payer: Self-pay

## 2021-10-28 VITALS — BP 128/62 | HR 82 | Temp 97.8°F | Ht 63.0 in | Wt 197.0 lb

## 2021-10-28 DIAGNOSIS — K921 Melena: Secondary | ICD-10-CM | POA: Diagnosis not present

## 2021-10-28 DIAGNOSIS — R110 Nausea: Secondary | ICD-10-CM | POA: Diagnosis not present

## 2021-10-28 DIAGNOSIS — R1084 Generalized abdominal pain: Secondary | ICD-10-CM

## 2021-10-28 MED ORDER — DICYCLOMINE HCL 10 MG PO CAPS
10.0000 mg | ORAL_CAPSULE | Freq: Three times a day (TID) | ORAL | 0 refills | Status: DC
Start: 2021-10-28 — End: 2023-11-01

## 2021-10-29 LAB — COMPREHENSIVE METABOLIC PANEL
ALT: 16 IU/L (ref 0–32)
AST: 20 IU/L (ref 0–40)
Albumin/Globulin Ratio: 1.6 (ref 1.2–2.2)
Albumin: 3.9 g/dL (ref 3.8–4.8)
Alkaline Phosphatase: 109 IU/L (ref 44–121)
BUN/Creatinine Ratio: 14 (ref 12–28)
BUN: 13 mg/dL (ref 8–27)
Bilirubin Total: 0.5 mg/dL (ref 0.0–1.2)
CO2: 25 mmol/L (ref 20–29)
Calcium: 9.6 mg/dL (ref 8.7–10.3)
Chloride: 98 mmol/L (ref 96–106)
Creatinine, Ser: 0.9 mg/dL (ref 0.57–1.00)
Globulin, Total: 2.4 g/dL (ref 1.5–4.5)
Glucose: 122 mg/dL — ABNORMAL HIGH (ref 70–99)
Potassium: 4.6 mmol/L (ref 3.5–5.2)
Sodium: 138 mmol/L (ref 134–144)
Total Protein: 6.3 g/dL (ref 6.0–8.5)
eGFR: 71 mL/min/{1.73_m2} (ref >59)

## 2021-10-29 LAB — CBC WITH DIFFERENTIAL/PLATELET
Basophils Absolute: 0.1 10*3/uL (ref 0.0–0.2)
Basos: 0 %
EOS (ABSOLUTE): 0.1 10*3/uL (ref 0.0–0.4)
Eos: 1 %
Hematocrit: 43.9 % (ref 34.0–46.6)
Hemoglobin: 14.8 g/dL (ref 11.1–15.9)
Immature Grans (Abs): 0 10*3/uL (ref 0.0–0.1)
Immature Granulocytes: 0 %
Lymphocytes Absolute: 3.7 10*3/uL — ABNORMAL HIGH (ref 0.7–3.1)
Lymphs: 22 %
MCH: 30.4 pg (ref 26.6–33.0)
MCHC: 33.7 g/dL (ref 31.5–35.7)
MCV: 90 fL (ref 79–97)
Monocytes Absolute: 1 10*3/uL — ABNORMAL HIGH (ref 0.1–0.9)
Monocytes: 6 %
Neutrophils Absolute: 11.9 10*3/uL — ABNORMAL HIGH (ref 1.4–7.0)
Neutrophils: 71 %
Platelets: 213 10*3/uL (ref 150–450)
RBC: 4.87 x10E6/uL (ref 3.77–5.28)
RDW: 12.5 % (ref 11.7–15.4)
WBC: 16.9 10*3/uL — ABNORMAL HIGH (ref 3.4–10.8)

## 2021-10-29 LAB — MAGNESIUM: Magnesium: 2 mg/dL (ref 1.6–2.3)

## 2021-10-29 LAB — LIPASE: Lipase: 28 U/L (ref 14–72)

## 2021-10-29 LAB — AMYLASE: Amylase: 85 U/L (ref 31–110)

## 2021-10-30 NOTE — Progress Notes (Signed)
Patient and her daughter was informed of results, she stated that she doesn't feel much better and still feels about the same. She is taking the Anti-nausea medication we gave her around the clock and it is helping but she has not had any new symptoms occur or fever.

## 2021-10-30 NOTE — Progress Notes (Signed)
? ?Acute Office Visit ? ?Subjective:  ? ? Patient ID: Kaitlyn Rosales, female    DOB: 1956/07/29, 66 y.o.   MRN: [id number] ? ?Chief Complaint  ?Patient presents with  ? Nausea  ?  Burning feeling like its indigestion  ? ? ?HPI: ?Patient is in today for complaints of generalized stomach pain for the past few days - she has had some diarrhea as well.  Denies nausea or vomiting but states she has had general malaise ?Denies urine symptoms ?Pt does state that a few weeks ago she had noted blood in her bowels - she was referred to GI for screening colonscopy recently but she cancelled appt ? ?Past Medical History:  ?Diagnosis Date  ? 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 Benign hypertension with CKD (chronic kidney disease) stage III ([place]) 09/08/2017  ? Cervical spinal stenosis 10/08/2018  ? Formatting of this note might be different from the original. Added automatically from request for surgery [id number]  ? Chronic respiratory failure with hypoxia ([place]) 01/09/2021  ? Gastroesophageal reflux disease without esophagitis 05/05/2017  ? History of MI (myocardial infarction) 03/07/2021  ? Formatting of this note might be different from the original. living in [place], never cathed (early 32s ~) patient reports MD said "light heart attack"  ? Mixed hyperlipidemia 10/29/2017  ? OSA (obstructive sleep apnea) 01/09/2021  ? Posttraumatic stress disorder 10/15/2016  ? ? ?Past Surgical History:  ?Procedure Laterality Date  ? APPENDECTOMY  1970  ? CERVICAL SPINE SURGERY  2020  ? CHOLECYSTECTOMY  1970  ? ELBOW SURGERY Right 1990  ? PARTIAL HYSTERECTOMY  1990  ? ? ?Family History  ?Problem Relation Age of Onset  ? Dementia Mother   ? Parkinson's disease Mother   ? Cirrhosis Father   ? Osteoarthritis Sister   ? Breast cancer Sister   ? COPD Sister   ? COPD Brother   ? ? ?Social History  ? ?Socioeconomic History  ? Marital status: Single  ?  Spouse name: Not on file  ? Number of children: 3  ? Years of education: Not  on file  ? Highest education level: High school graduate  ?Occupational History  ? Occupation: Disabled  ?Tobacco Use  ? Smoking status: Every Day  ?  Packs/day: 0.50  ?  Types: Cigarettes  ? Smokeless tobacco: Never  ?Vaping Use  ? Vaping Use: Never used  ?Substance and Sexual Activity  ? Alcohol use: Not Currently  ? Drug use: Never  ? Sexual activity: Yes  ?  Partners: Male  ?Other Topics Concern  ? Not on file  ?Social History Narrative  ? Not on file  ? ?Social Determinants of Health  ? ?Financial Resource Strain: Not on file  ?Food Insecurity: Not on file  ?Transportation Needs: Not on file  ?Physical Activity: Not on file  ?Stress: Not on file  ?Social Connections: Not on file  ?Intimate Partner Violence: Not on file  ? ? ?Outpatient Medications Prior to Visit  ?Medication Sig Dispense Refill  ? albuterol (PROVENTIL) (2.5 MG/3ML) 0.083% nebulizer solution USE 1 VIAL PER NEBULIZER EVERY 6 HOURS AS NEEDED FOR WHEEZING 75 mL 2  ? atorvastatin (LIPITOR) 40 MG tablet Take 1 tablet by mouth daily.    ? benzonatate (TESSALON) 100 MG capsule Take 1 capsule (100 mg total) by mouth 3 (three) times daily as needed. 30 capsule 1  ? Blood Glucose Monitoring Suppl (ONE TOUCH ULTRA 2) w/Device KIT 1 each by Does not  apply route daily. 1 kit 0  ? Cholecalciferol 25 MCG (1000 UT) tablet Take by mouth.    ? cyclobenzaprine (FLEXERIL) 5 MG tablet Take 1 tablet by mouth 3 (three) times daily as needed.    ? diazepam (VALIUM) 5 MG tablet Take one tablet daily for anxiety and panic attacks as needed 30 tablet 0  ? ferrous sulfate 325 (65 FE) MG tablet Take by mouth.    ? glucose blood test strip Use as instructed 100 each 10  ? lisinopril (ZESTRIL) 10 MG tablet TAKE ONE TABLET DAILY IN THE MORNING FOR BLOOD PRESSURE    ? meclizine (ANTIVERT) 25 MG tablet Take by mouth.    ? ondansetron (ZOFRAN) 8 MG tablet Take by mouth.    ? OXYGEN 2 L by Does not apply route at bedtime.    ? pantoprazole (PROTONIX) 40 MG tablet TAKE 1 TABLET BY  MOUTH ONCE DAILY FOR STOMACH/REFLUX    ? Tiotropium Bromide-Olodaterol 2.5-2.5 MCG/ACT AERS 2 inhalations once daily 1 each 5  ? Vibegron (GEMTESA) 75 MG TABS Take 75 mg by mouth daily. 90 tablet 0  ? ?No facility-administered medications prior to visit.  ? ? ?Allergies  ?Allergen Reactions  ? Amoxicillin-Pot Clavulanate Hives  ?  Took benadryl and symptoms resolved in 2 days. No angioedema.  ? Levofloxacin Itching and Rash  ? Ceftriaxone Other (See Comments)  ?  coughing, difficulty swallowing, rash - unclear if caused by ceftriaxone - has tolerated cefazolin and cephalexin previously  ? Nitrofurantoin Other (See Comments), Swelling and Hives  ?  Required hospitalization  ? Oxycodone-Acetaminophen Other (See Comments)  ?  Auditory hallucinations, unable to sleep  ? Tape Dermatitis  ?  Paper tape is fine  ? Sulfamethoxazole-Trimethoprim Rash  ? ? ?Review of Systems ?CONSTITUTIONAL: see HPI ?E/N/T: Negative for ear pain, nasal congestion and sore throat.  ?CARDIOVASCULAR: Negative for chest pain, dizziness, palpitations and pedal edema.  ?RESPIRATORY: Negative for recent cough and dyspnea.  ?GASTROINTESTINAL: see HPI ?MSK: Negative for arthralgias and myalgias.  ?INTEGUMENTARY: Negative for rash.  ? ?   ? ?   ?Objective:  ?  ?Physical Exam ? ?BP 128/62 (BP Location: Left Arm, Patient Position: Sitting)   Pulse 82   Temp 97.8 ?F (36.6 ?C) (Oral)   Ht $R'5\' 3"'zG$  (1.6 m)   Wt 197 lb (89.4 kg)   LMP  (LMP Unknown)   SpO2 94%   BMI 34.90 kg/m?  ?Wt Readings from Last 3 Encounters:  ?10/28/21 197 lb (89.4 kg)  ?10/10/21 195 lb 3.2 oz (88.5 kg)  ?10/02/21 194 lb 12.8 oz (88.4 kg)  ?PHYSICAL EXAM:  ? ?VS: BP 128/62 (BP Location: Left Arm, Patient Position: Sitting)   Pulse 82   Temp 97.8 ?F (36.6 ?C) (Oral)   Ht $R'5\' 3"'jD$  (1.6 m)   Wt 197 lb (89.4 kg)   LMP  (LMP Unknown)   SpO2 94%   BMI 34.90 kg/m?  ? ?GEN: Well nourished, well developed, in no acute distress  ?HEENT: normal external ears and nose - normal external  auditory canals and TMS - - Lips, Teeth and Gums - normal  ?Oropharynx - normal mucosa, palate, and posterior pharynx ?Cardiac: RRR; no murmurs, rubs, or gallops,no edema - ?Respiratory:  normal respiratory rate and pattern with no distress - normal breath sounds with no rales, rhonchi, wheezes or rubs ?GI: normal bowel sounds, no masses or tenderness ?Skin: warm and dry, no rash  ? ? ? ? ?Health Maintenance Due  ?  Topic Date Due  ? OPHTHALMOLOGY EXAM  Never done  ? TETANUS/TDAP  Never done  ? DEXA SCAN  Never done  ? ? ?There are no preventive care reminders to display for this patient. ? ? ?Lab Results  ?Component Value Date  ? TSH 2.460 06/21/2021  ? ?Lab Results  ?Component Value Date  ? WBC 16.9 (H) 10/28/2021  ? HGB 14.8 10/28/2021  ? HCT 43.9 10/28/2021  ? MCV 90 10/28/2021  ? PLT 213 10/28/2021  ? ?Lab Results  ?Component Value Date  ? NA 138 10/28/2021  ? K 4.6 10/28/2021  ? CO2 25 10/28/2021  ? GLUCOSE 122 (H) 10/28/2021  ? BUN 13 10/28/2021  ? CREATININE 0.90 10/28/2021  ? BILITOT 0.5 10/28/2021  ? ALKPHOS 109 10/28/2021  ? AST 20 10/28/2021  ? ALT 16 10/28/2021  ? PROT 6.3 10/28/2021  ? ALBUMIN 3.9 10/28/2021  ? CALCIUM 9.6 10/28/2021  ? EGFR 71 10/28/2021  ? ?Lab Results  ?Component Value Date  ? CHOL 165 10/02/2021  ? ?Lab Results  ?Component Value Date  ? HDL 53 10/02/2021  ? ?Lab Results  ?Component Value Date  ? [place] 89 10/02/2021  ? ?Lab Results  ?Component Value Date  ? TRIG 128 10/02/2021  ? ?Lab Results  ?Component Value Date  ? CHOLHDL 3.1 10/02/2021  ? ?Lab Results  ?Component Value Date  ? HGBA1C 6.4 (H) 10/02/2021  ? ? ?   ?Assessment & Plan:  ? ?Problem List Items Addressed This Visit   ?None ?Visit Diagnoses   ? ? Generalized abdominal pain    -  Primary  ? Relevant Orders  ? CBC with Differential/Platelet (Completed)  ? Comprehensive metabolic panel (Completed)  ? Magnesium (Completed)  ? Amylase (Completed)  ? Lipase (Completed)  ? Ambulatory referral to Gastroenterology  ? Nausea       ? Relevant Orders  ? Amylase (Completed)  ? Lipase (Completed)  ? Hematochezia      ? Relevant Orders  ? Ambulatory referral to Gastroenterology  ? ?  ? ?Meds ordered this encounter  ?Medications  ? dicyclomine (BENT

## 2021-10-31 ENCOUNTER — Telehealth: Payer: Self-pay

## 2021-10-31 NOTE — Telephone Encounter (Signed)
[name], daughter, callign to understand lab results. Results given, she VU. Questioned if bentyl could make patient constipated. Enema given this morning w/ little relief.  ? ?Spoke with provider, she does not believe this. Patient complains of soreness in abd, weakness, and not herself. Provider recommends patient proceed to ED for evaluation. Daughter VU and will proceed to [facility].  ? ?[name] 10/31/21 1:52 PM ? ?

## 2021-11-14 ENCOUNTER — Other Ambulatory Visit: Payer: Self-pay | Admitting: Physician Assistant

## 2021-11-28 ENCOUNTER — Other Ambulatory Visit: Payer: Self-pay | Admitting: Physician Assistant

## 2021-11-28 DIAGNOSIS — J441 Chronic obstructive pulmonary disease with (acute) exacerbation: Secondary | ICD-10-CM

## 2021-12-18 ENCOUNTER — Other Ambulatory Visit: Payer: Self-pay | Admitting: Physician Assistant

## 2021-12-18 ENCOUNTER — Telehealth: Payer: Self-pay

## 2021-12-18 ENCOUNTER — Other Ambulatory Visit: Payer: Self-pay

## 2021-12-18 DIAGNOSIS — N958 Other specified menopausal and perimenopausal disorders: Secondary | ICD-10-CM

## 2021-12-18 NOTE — Telephone Encounter (Signed)
Attempted to reach patient regarding recommendations from [facility] for osteopenia and her upcoming appointment in July. ?

## 2021-12-23 ENCOUNTER — Other Ambulatory Visit: Payer: Self-pay | Admitting: Physician Assistant

## 2021-12-25 ENCOUNTER — Other Ambulatory Visit: Payer: Self-pay

## 2021-12-25 DIAGNOSIS — Z1231 Encounter for screening mammogram for malignant neoplasm of breast: Secondary | ICD-10-CM

## 2021-12-26 LAB — HM COLONOSCOPY

## 2021-12-30 ENCOUNTER — Other Ambulatory Visit: Payer: Self-pay | Admitting: Physician Assistant

## 2022-01-01 ENCOUNTER — Ambulatory Visit: Payer: 59 | Admitting: Physician Assistant

## 2022-01-16 ENCOUNTER — Encounter: Payer: Self-pay | Admitting: Physician Assistant

## 2022-02-14 ENCOUNTER — Ambulatory Visit: Payer: 59 | Admitting: Physician Assistant

## 2022-03-17 ENCOUNTER — Other Ambulatory Visit: Payer: Self-pay | Admitting: Physician Assistant

## 2022-03-17 DIAGNOSIS — J441 Chronic obstructive pulmonary disease with (acute) exacerbation: Secondary | ICD-10-CM

## 2022-05-09 ENCOUNTER — Other Ambulatory Visit: Payer: Self-pay | Admitting: Neurosurgery

## 2022-05-09 DIAGNOSIS — I671 Cerebral aneurysm, nonruptured: Secondary | ICD-10-CM

## 2022-05-26 ENCOUNTER — Other Ambulatory Visit: Payer: Medicare Other

## 2022-05-27 ENCOUNTER — Other Ambulatory Visit: Payer: Self-pay | Admitting: Neurosurgery

## 2022-05-27 DIAGNOSIS — M542 Cervicalgia: Secondary | ICD-10-CM

## 2022-06-05 ENCOUNTER — Ambulatory Visit
Admission: RE | Admit: 2022-06-05 | Discharge: 2022-06-05 | Disposition: A | Discharge status: Home / Self Care | Payer: Medicare Other | Source: Ambulatory Visit | Attending: Neurosurgery | Admitting: Neurosurgery

## 2022-06-05 DIAGNOSIS — I671 Cerebral aneurysm, nonruptured: Secondary | ICD-10-CM

## 2022-06-10 ENCOUNTER — Other Ambulatory Visit: Payer: Self-pay | Admitting: Physician Assistant

## 2022-06-10 DIAGNOSIS — J441 Chronic obstructive pulmonary disease with (acute) exacerbation: Secondary | ICD-10-CM

## 2022-06-19 ENCOUNTER — Other Ambulatory Visit: Payer: Medicare Other

## 2022-07-18 ENCOUNTER — Ambulatory Visit
Admission: RE | Admit: 2022-07-18 | Discharge: 2022-07-18 | Disposition: A | Discharge status: Home / Self Care | Payer: Medicare Other | Source: Ambulatory Visit | Attending: Neurosurgery | Admitting: Neurosurgery

## 2022-07-18 DIAGNOSIS — M542 Cervicalgia: Secondary | ICD-10-CM

## 2022-07-20 IMAGING — MR MR MRA HEAD W/O CM
1 series · 23 of 48 positions shown · non-contrast
Comparison: 05/25/2020

CLINICAL DATA: Follow-up cerebral aneurysm.

EXAM:
MRA HEAD WITHOUT CONTRAST
TECHNIQUE: Angiographic images of the Circle of Willis were acquired using MRA
technique without intravenous contrast.

[Series 4: tof_3d_multi-slab · axial · 0.7mm · 0.35mm/px · z∈[-5,+90]mm · 23 of 143 slices shown]
[im 1/143]
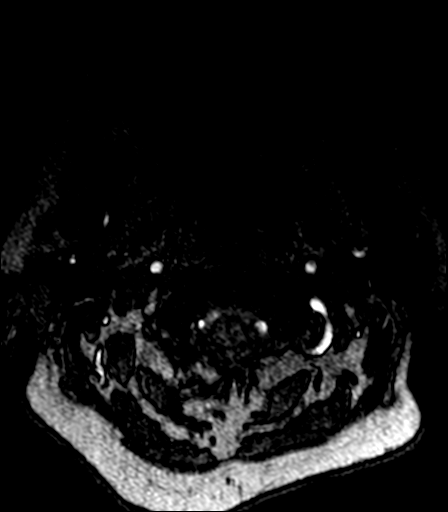
[im 4/143]
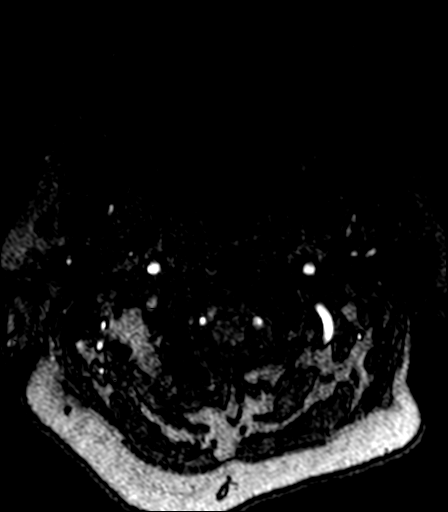
[im 7/143]
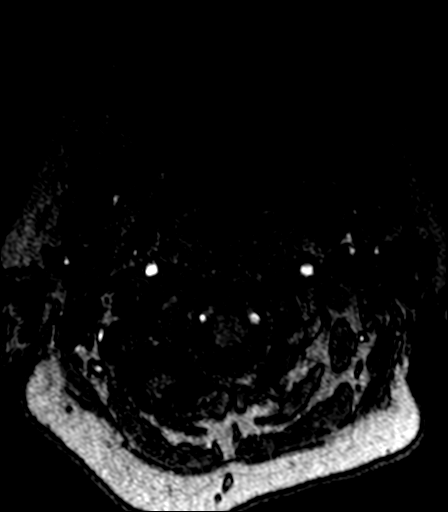
[im 10/143]
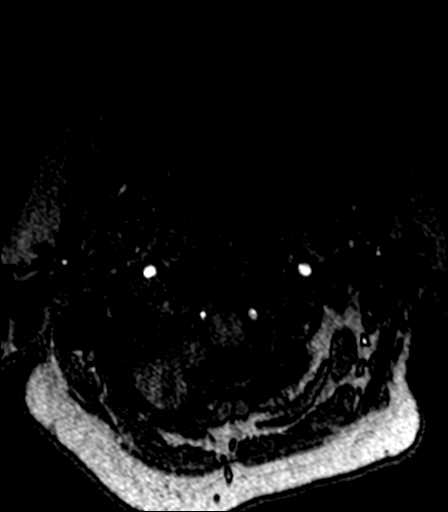
[im 13/143]
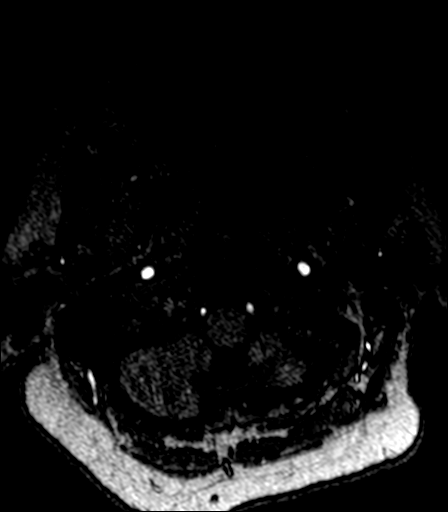
[im 16/143]
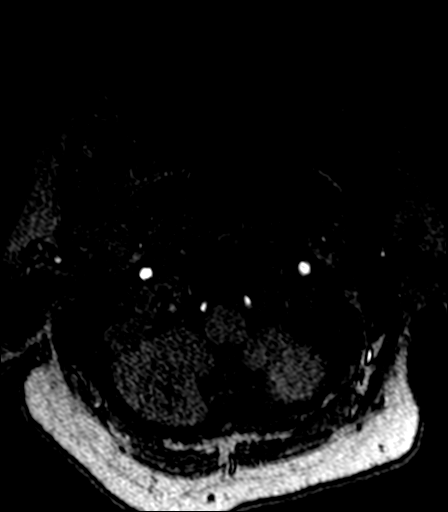
[im 19/143]
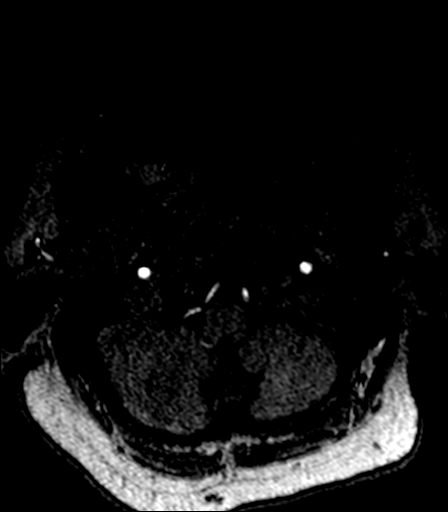
[im 22/143]
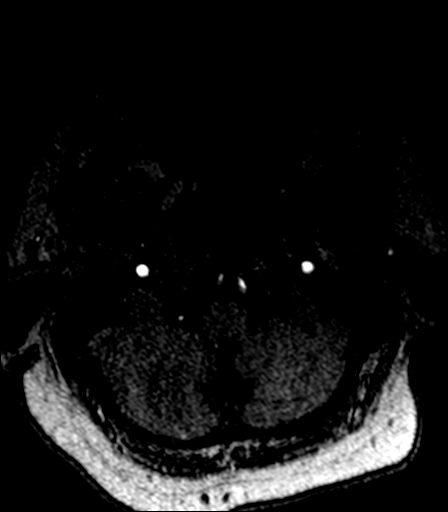
[im 25/143]
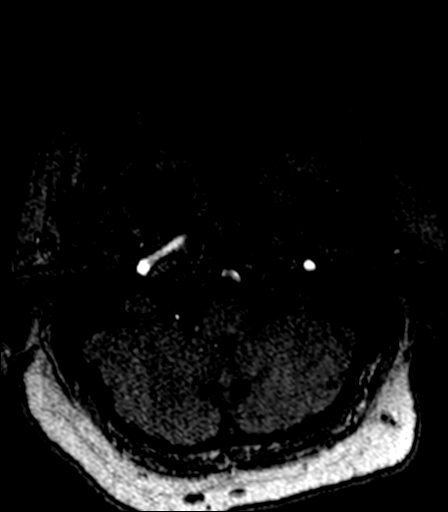
[im 28/143]
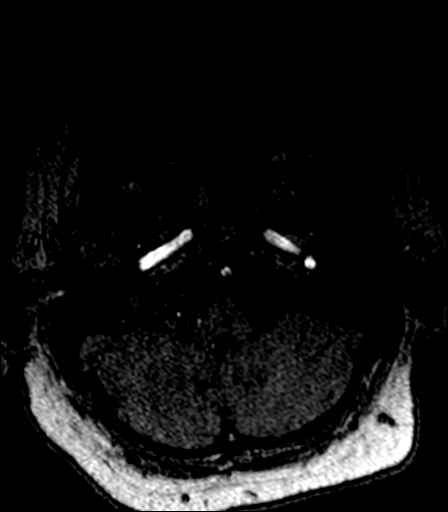
[im 31/143]
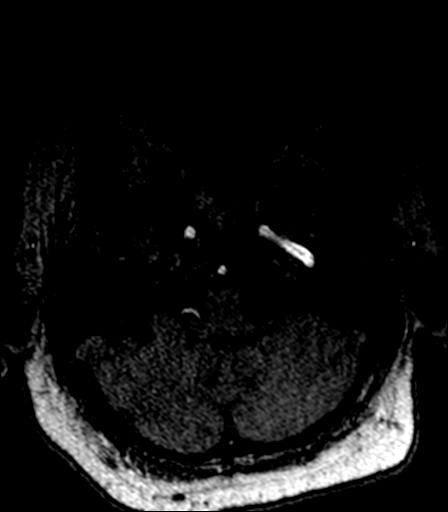
[im 34/143]
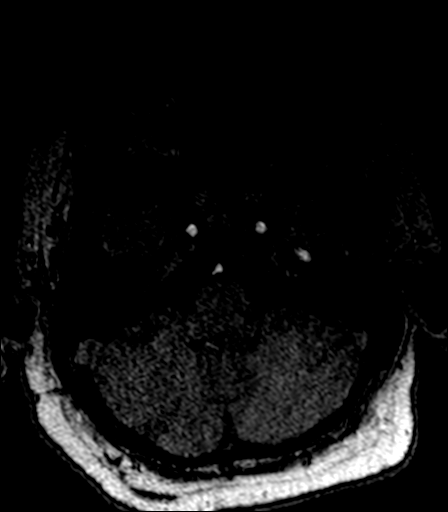
[im 37/143]
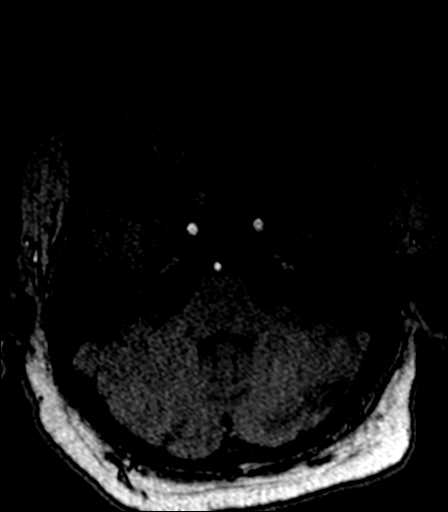
[im 40/143]
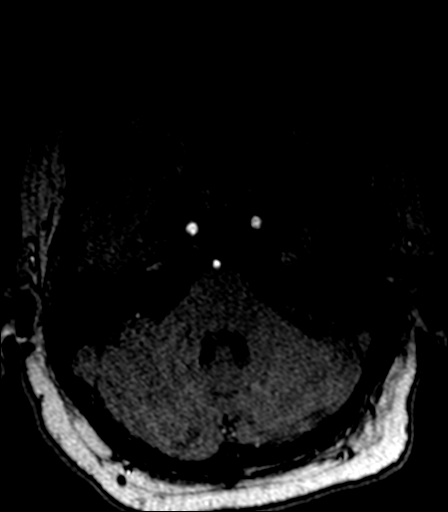
[im 43/143]
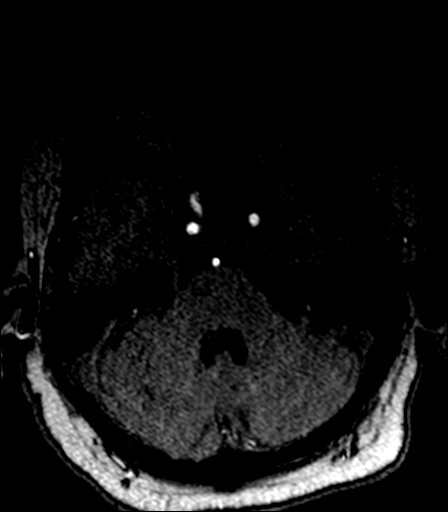
[im 46/143]
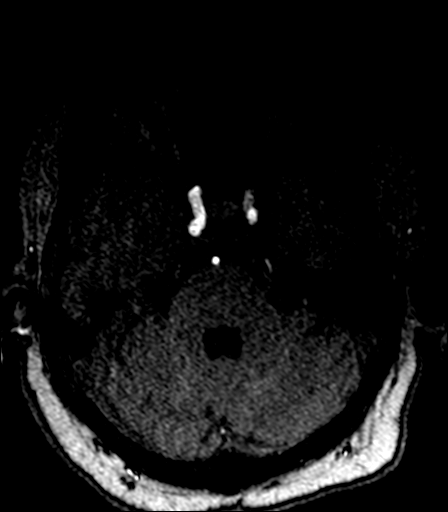
[im 64/143]
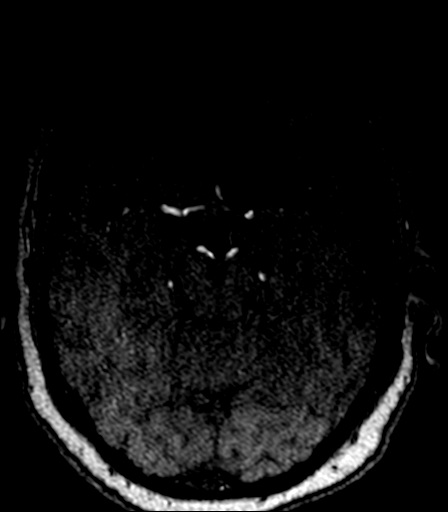
[im 73/143]
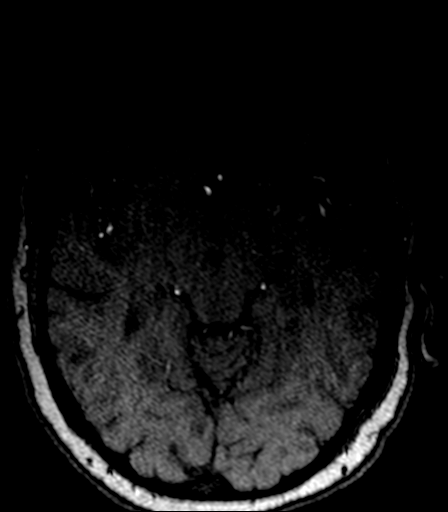
[im 82/143]
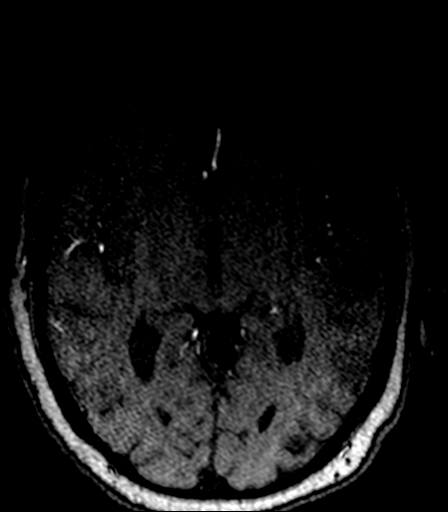
[im 100/143]
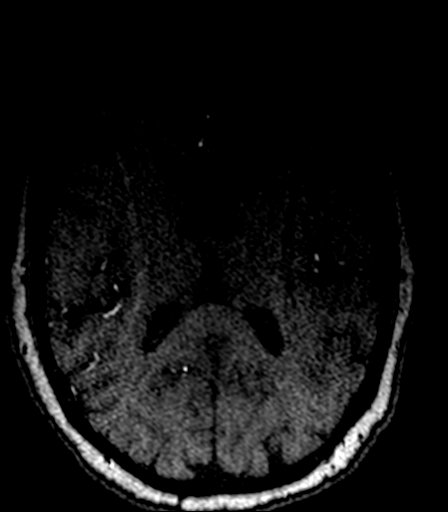
[im 118/143]
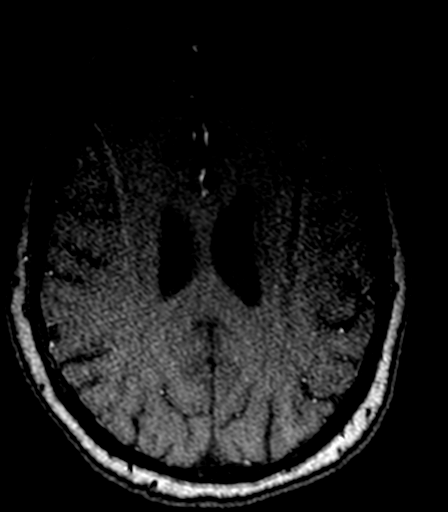
[im 121/143]
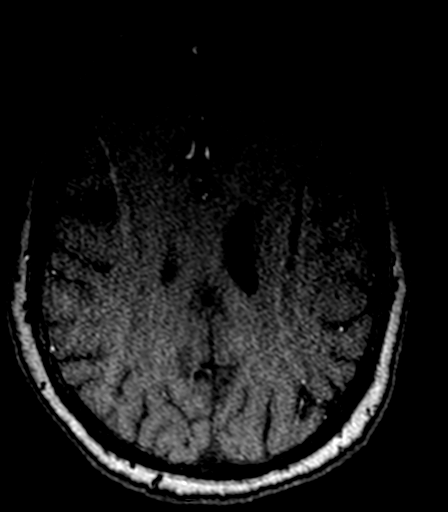
[im 136/143]
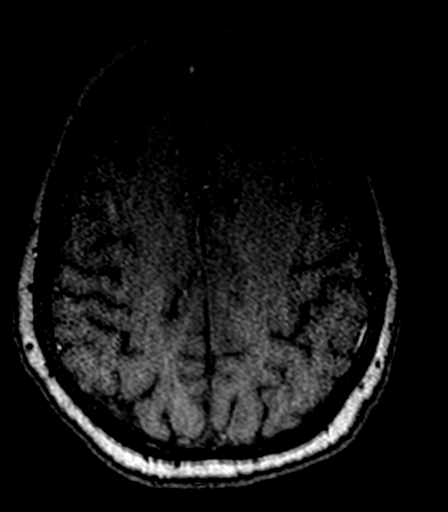

[23 of 48 positions shown; findings below may reference images not displayed]

FINDINGS: Anterior circulation: The internal carotid arteries are widely
patent from skull base to carotid termini. A 4 x 2 mm aneurysm
projecting medially from the anterior genu of the left ICA is
unchanged. ACAs and MCAs are patent with likely artifactual partial
signal loss in the distal left M1 and bilateral M2 segments which
limits assessment for a stenosis in these regions, however no
significant stenosis was evident on the prior study.

Posterior circulation: The included distal vertebral arteries are
widely patent to the basilar with the left being dominant. Patent
right PICA, right AICA, and bilateral SCA origins are visualized. A
left PICA and left AICA are not clearly identified. There are right
larger than left posterior communicating arteries. Both PCAs are
patent without evidence of a significant proximal stenosis. No
aneurysm is identified.

Anatomic variants: None.
IMPRESSION: Unchanged 4 x 2 mm left ICA aneurysm.

## 2022-11-25 ENCOUNTER — Other Ambulatory Visit: Payer: Self-pay | Admitting: Family Medicine

## 2022-11-25 DIAGNOSIS — J441 Chronic obstructive pulmonary disease with (acute) exacerbation: Secondary | ICD-10-CM

## 2023-10-08 ENCOUNTER — Ambulatory Visit (INDEPENDENT_AMBULATORY_CARE_PROVIDER_SITE_OTHER): Payer: 59 | Admitting: Podiatry

## 2023-10-08 DIAGNOSIS — L84 Corns and callosities: Secondary | ICD-10-CM

## 2023-10-08 DIAGNOSIS — M205X1 Other deformities of toe(s) (acquired), right foot: Secondary | ICD-10-CM | POA: Diagnosis not present

## 2023-10-08 NOTE — Progress Notes (Signed)
 Chief Complaint  Patient presents with   Callouses    NP here today for a corn/callous between her 4th and 5th toes on the right foot. It has been present for 4 months. Dr. Tristan Schroeder shaved it down couple months ago but it is bigger and now painful. Lat A1c was 6.5, three months ago. No anti coag   HPI: 68 y.o. female presents today with painful corn in the right fourth interspace.  Denies drainage.  Notes that it is tender.  Patient notes her anxiety is so high, that if she feels any discomfort, she will pass out.  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 Added automatically from request for surgery [id number]   Chronic respiratory failure with hypoxia ([facility]) 01/09/2021   Gastroesophageal reflux disease without esophagitis 05/05/2017   History of MI (myocardial infarction) 03/07/2021   Formatting of this note might be different from the original. living in [place], never cathed (early 40s ~) patient reports MD said "light heart attack"   Mixed hyperlipidemia 10/29/2017   OSA (obstructive sleep apnea) 01/09/2021   Posttraumatic stress disorder 10/15/2016    Past Surgical History:  Procedure Laterality Date   APPENDECTOMY  1970   CERVICAL SPINE SURGERY  2020   CHOLECYSTECTOMY  1970   ELBOW SURGERY Right 1990   PARTIAL HYSTERECTOMY  1990    Allergies  Allergen Reactions   Amoxicillin-Pot Clavulanate Hives    Took benadryl and symptoms resolved in 2 days. No angioedema.   Doxycycline Anaphylaxis   Levofloxacin Itching and Rash   Ceftriaxone Other (See Comments)    coughing, difficulty swallowing, rash - unclear if caused by ceftriaxone - has tolerated cefazolin and cephalexin previously   Nitrofurantoin Other (See Comments), Swelling and Hives     Required hospitalization   Oxycodone-Acetaminophen Other (See Comments)    Auditory hallucinations, unable to sleep   Tape Dermatitis    Paper tape is fine   Sulfamethoxazole-Trimethoprim Rash    Physical Exam: There are palpable pedal pulses.  No surrounding erythema or edema is noted near the right fourth interspace.  There is a soft hyperkeratotic lesion in the right fourth interspace with pain on palpation.  No ulceration is noted.  There is adduction and varus rotation of the right fifth and fourth toe.  Epicritic sensation is intact  Assessment/Plan of Care: 1. Corns   2. Adductovarus rotation of toe, acquired, right    Discussed clinical findings with patient today.  After softening the lesion with solution, this was shaved with a sterile #88 blade today.  The patient was fitted for a gel mini double buddy toe spacer.  At this can help try to splay the toes and decrease continue to friction between the toes.  Briefly discussed surgical intervention but the patient states that due to her anxiety she does not he want to think about proceeding with surgery to straighten, correct the toes and remove the prominent bone.  Follow up as needed  Lydia Toren Orland Mustard, DPM, FACFAS [facility]     [address]  [place], [place] [address]                Office [phone]  Fax [phone]

## 2023-10-29 ENCOUNTER — Ambulatory Visit: Admitting: Podiatry

## 2023-11-01 ENCOUNTER — Ambulatory Visit (HOSPITAL_BASED_OUTPATIENT_CLINIC_OR_DEPARTMENT_OTHER): Admission: EM | Admit: 2023-11-01 | Discharge: 2023-11-01 | Disposition: A | Discharge status: Home / Self Care

## 2023-11-01 ENCOUNTER — Encounter (HOSPITAL_BASED_OUTPATIENT_CLINIC_OR_DEPARTMENT_OTHER): Payer: Self-pay | Admitting: Emergency Medicine

## 2023-11-01 ENCOUNTER — Other Ambulatory Visit (HOSPITAL_BASED_OUTPATIENT_CLINIC_OR_DEPARTMENT_OTHER): Admitting: Radiology

## 2023-11-01 DIAGNOSIS — J329 Chronic sinusitis, unspecified: Secondary | ICD-10-CM | POA: Diagnosis not present

## 2023-11-01 DIAGNOSIS — J441 Chronic obstructive pulmonary disease with (acute) exacerbation: Secondary | ICD-10-CM | POA: Diagnosis not present

## 2023-11-01 DIAGNOSIS — R051 Acute cough: Secondary | ICD-10-CM

## 2023-11-01 LAB — POC COVID19/FLU A&B COMBO
Covid Antigen, POC: NEGATIVE
Influenza A Antigen, POC: NEGATIVE
Influenza B Antigen, POC: NEGATIVE

## 2023-11-01 MED ORDER — METHYLPREDNISOLONE ACETATE 40 MG/ML IJ SUSP
40.0000 mg | Freq: Once | INTRAMUSCULAR | Status: AC
  Administered 2023-11-01: 40 mg via INTRAMUSCULAR

## 2023-11-01 MED ORDER — FLUCONAZOLE 150 MG PO TABS: ORAL_TABLET | ORAL | 0 refills | Status: DC

## 2023-11-01 MED ORDER — AZITHROMYCIN 500 MG PO TABS: 500.0000 mg | ORAL_TABLET | Freq: Every day | ORAL | 0 refills | Status: AC

## 2023-11-01 NOTE — Discharge Instructions (Signed)
 Exam is consistent with COPD exacerbation and sinusitis.  Chest x-ray had been scheduled but there was a delay in the patient preferred not to have the chest x-ray.  The patient has diabetes and COPD and has problems with many medication allergies that are anaphylactic.  She can take azithromycin.  Will take azithromycin 500 mg daily for 5 days.  Fluconazole, 150 mg 1 now and 1 in a week if needed for vaginal yeast infection after antibiotic and prednisone use.    Depo-Medrol injection for her COPD exacerbation.  Advised to monitor her blood glucose levels as the steroid could really raise her sugars.  Follow-up here or with her family doctor if she is not significantly improved in 2 weeks return sooner if needed.

## 2023-11-01 NOTE — ED Provider Notes (Signed)
 [facility]    CSN: [id number] Arrival date & time: 11/01/23  0919      History   Chief Complaint Chief Complaint  Patient presents with   Cough   Headache   Shortness of Breath   Nasal Congestion    HPI Kaitlyn Rosales is a 68 y.o. female.   Patient is here with her adult daughter.  Patient reports that she has had cough, SOB, nasal congestion, headache and rib cage pain from coughing since yesterday (10/31/23). Pt has a history of COPD. She has been using her nebulizer to help with symptoms.     Cough Associated symptoms: headaches, rhinorrhea and shortness of breath   Associated symptoms: no chest pain, no chills, no ear pain, no fever, no rash and no sore throat   Headache Associated symptoms: congestion, cough, drainage and sinus pressure   Associated symptoms: no abdominal pain, no back pain, no diarrhea, no ear pain, no eye pain, no fever, no nausea, no seizures, no sore throat and no vomiting   Shortness of Breath Associated symptoms: cough and headaches   Associated symptoms: no abdominal pain, no chest pain, no ear pain, no fever, no rash, no sore throat and no vomiting     Past Medical History:  Diagnosis Date   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 Added automatically from request for surgery [id number]   Chronic respiratory failure with hypoxia (HCC) 01/09/2021   Gastroesophageal reflux disease without esophagitis 05/05/2017   History of MI (myocardial infarction) 03/07/2021   Formatting of this note might be different from the original. living in [place], never cathed (early 40s ~) patient reports MD said "light heart attack"   Mixed hyperlipidemia 10/29/2017   OSA (obstructive sleep apnea) 01/09/2021   Posttraumatic stress disorder 10/15/2016     Patient Active Problem List   Diagnosis Date Noted   Allergic reaction 08/04/2021   Community acquired pneumonia 08/04/2021   COPD with acute exacerbation (HCC) 08/03/2021   Prediabetes 06/21/2021   Lumbar spondylosis 04/09/2021   OAB (overactive bladder) 04/09/2021   COPD (chronic obstructive pulmonary disease) (HCC) 03/07/2021   History of MI (myocardial infarction) 03/07/2021   Hypovitaminosis D 03/07/2021   Chronic respiratory failure with hypoxia (HCC) 01/09/2021   OSA (obstructive sleep apnea) 01/09/2021   Smoking greater than 40 pack years 01/09/2021   Intracranial aneurysm 08/22/2019   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   Other dysphagia 03/01/2019   S/P cervical spinal fusion 02/05/2019   Cervical spinal stenosis 10/08/2018   High risk medication use 09/22/2018   Mixed hyperlipidemia 10/29/2017   Benign hypertension with CKD (chronic kidney disease) stage III (HCC) 09/08/2017   Tobacco abuse 09/08/2017   Cigarette nicotine dependence with nicotine-induced disorder 09/08/2017   Gastroesophageal reflux disease without esophagitis 05/05/2017   Posttraumatic stress disorder 10/15/2016    Past Surgical History:  Procedure Laterality Date   APPENDECTOMY  1970   CERVICAL SPINE SURGERY  2020   CHOLECYSTECTOMY  1970   ELBOW SURGERY Right 1990   PARTIAL HYSTERECTOMY  1990    OB History     Gravida  3   Para  3   Term  2   Preterm  1   AB  0   Living  2  SAB      IAB      Ectopic      Multiple      Live Births               Home Medications    Prior to Admission medications   Medication Sig Start Date End Date Taking? Authorizing Provider  azithromycin (ZITHROMAX) 500 MG tablet Take 1 tablet (500 mg total) by mouth daily for 5 days. 11/01/23 11/06/23 Yes Prescilla Sours, FNP  ferrous sulfate 325 (65 FE) MG tablet Take by mouth.   Yes [provider]  fluconazole (DIFLUCAN) 150 MG tablet By  mouth today and repeat in 5-7 days for vaginal yeast infection. 11/01/23  Yes Prescilla Sours, FNP  meclizine (ANTIVERT) 25 MG tablet Take by mouth. 11/27/20  Yes [provider]  omeprazole (PRILOSEC) 20 MG capsule Take 20 mg by mouth daily.   Yes [provider]  albuterol (PROVENTIL) (2.5 MG/3ML) 0.083% nebulizer solution USE 1 VIAL PER NEBULIZER EVERY 6 HOURS AS NEEDED FOR WHEEZING 11/28/21   Marianne Sofia, [name]  atorvastatin (LIPITOR) 40 MG tablet Take 1 tablet by mouth daily. 11/27/20   [provider]  Blood Glucose Monitoring Suppl (ONE TOUCH ULTRA 2) w/Device KIT 1 each by Does not apply route daily. 03/18/21   Abigail Miyamoto, MD  Cholecalciferol 25 MCG (1000 UT) tablet Take by mouth.    [provider]  cyclobenzaprine (FLEXERIL) 5 MG tablet Take 1 tablet by mouth 3 (three) times daily as needed. 11/02/20   [provider]  diazepam (VALIUM) 5 MG tablet Take one tablet daily for anxiety and panic attacks as needed 12/30/21   [facility], Fritzi Mandes, MD  glucose blood test strip Use as instructed 03/26/21   Marianne Sofia, [name]  ondansetron (ZOFRAN) 8 MG tablet Take by mouth. 11/27/20   [provider]  OXYGEN 2 L by Does not apply route at bedtime.    [provider]    Family History Family History  Problem Relation Age of Onset   Dementia Mother    Parkinson's disease Mother    Cirrhosis Father    Osteoarthritis Sister    Breast cancer Sister    COPD Sister    COPD Brother     Social History Social History   Tobacco Use   Smoking status: Every Day    Current packs/day: 0.50    Types: Cigarettes   Smokeless tobacco: Never  Vaping Use   Vaping status: Never Used  Substance Use Topics   Alcohol use: Not Currently   Drug use: Never     Allergies   Amoxicillin-pot clavulanate, Doxycycline, Levofloxacin, Ceftriaxone, Acetaminophen, Clavulanic acid, Nitrofurantoin, Oxycodone-acetaminophen, Tape, and  Sulfamethoxazole-trimethoprim   Review of Systems Review of Systems  Constitutional:  Negative for chills and fever.  HENT:  Positive for congestion, postnasal drip, rhinorrhea, sinus pressure and sinus pain. Negative for ear pain and sore throat.   Eyes:  Negative for pain and visual disturbance.  Respiratory:  Positive for cough and shortness of breath.   Cardiovascular:  Negative for chest pain and palpitations.  Gastrointestinal:  Negative for abdominal pain, constipation, diarrhea, nausea and vomiting.  Genitourinary:  Negative for dysuria and hematuria.  Musculoskeletal:  Negative for arthralgias and back pain.  Skin:  Negative for color change and rash.  Neurological:  Positive for headaches. Negative for seizures and syncope.  All other systems reviewed and are negative.    Physical Exam Triage Vital Signs ED Triage Vitals  Encounter Vitals Group     BP 11/01/23 1037 (!) 150/84     Systolic BP Percentile --      Diastolic BP Percentile --      Pulse Rate 11/01/23 1037 83     Resp 11/01/23 1037 16     Temp 11/01/23 1037 98.2 F (36.8 C)     Temp Source 11/01/23 1037 Oral     SpO2 11/01/23 1037 94 %     Weight --      Height --      Head Circumference --      Peak Flow --      Pain Score 11/01/23 1031 0     Pain Loc --      Pain Education --      Exclude from Growth Chart --    No data found.  Updated Vital Signs BP (!) 150/84 (BP Location: Right Arm)   Pulse 83   Temp 98.2 F (36.8 C) (Oral)   Resp 16   LMP  (LMP Unknown)   SpO2 94%   Visual Acuity Right Eye Distance:   Left Eye Distance:   Bilateral Distance:    Right Eye Near:   Left Eye Near:    Bilateral Near:     Physical Exam Vitals and nursing note reviewed.  Constitutional:      General: She is not in acute distress.    Appearance: She is well-developed. She is not ill-appearing or toxic-appearing.  HENT:     Head: Normocephalic and atraumatic.     Right Ear: Hearing, tympanic  membrane, ear canal and external ear normal.     Left Ear: Hearing, tympanic membrane, ear canal and external ear normal.     Nose: Congestion and rhinorrhea present. Rhinorrhea is clear.     Right Sinus: Maxillary sinus tenderness and frontal sinus tenderness present.     Left Sinus: Maxillary sinus tenderness and frontal sinus tenderness present.     Mouth/Throat:     Lips: Pink.     Mouth: Mucous membranes are moist.     Pharynx: Uvula midline. No oropharyngeal exudate or posterior oropharyngeal erythema.     Tonsils: No tonsillar exudate.  Eyes:     Conjunctiva/sclera: Conjunctivae normal.     Pupils: Pupils are equal, round, and reactive to light.  Cardiovascular:     Rate and Rhythm: Normal rate and regular rhythm.     Heart sounds: S1 normal and S2 normal. No murmur heard. Pulmonary:     Effort: Pulmonary effort is normal. No respiratory distress.     Breath sounds: Examination of the right-upper field reveals wheezing and rhonchi. Examination of the left-upper field reveals wheezing and rhonchi. Examination of the right-middle field reveals wheezing. Examination of the left-middle field reveals wheezing. Examination of the right-lower field reveals decreased breath sounds and wheezing. Examination of the left-lower field reveals decreased breath sounds and wheezing. Decreased breath sounds, wheezing and rhonchi present. No rales.  Abdominal:     General: Bowel sounds are normal.     Palpations: Abdomen is soft.     Tenderness: There is no abdominal tenderness.  Musculoskeletal:        General: No swelling.     Cervical back: Neck supple.  Lymphadenopathy:     Head:     Right side of head: No submental, submandibular, tonsillar, preauricular or posterior auricular adenopathy.     Left side of head: No submental, submandibular, tonsillar, preauricular or posterior auricular adenopathy.  Cervical: Cervical adenopathy present.     Right cervical: Superficial cervical adenopathy  present.     Left cervical: Superficial cervical adenopathy present.  Skin:    General: Skin is warm and dry.     Capillary Refill: Capillary refill takes less than 2 seconds.     Findings: No rash.  Neurological:     Mental Status: She is alert and oriented to person, place, and time.  Psychiatric:        Mood and Affect: Mood normal.      UC Treatments / Results  Labs (all labs ordered are listed, but only abnormal results are displayed) Labs Reviewed  POC COVID19/FLU A&B COMBO - Normal    EKG   Radiology No results found.  Procedures Procedures (including critical care time)  Medications Ordered in UC Medications  methylPREDNISolone acetate (DEPO-MEDROL) injection 40 mg (40 mg Intramuscular Given 11/01/23 1151)    Initial Impression / Assessment and Plan / UC Course  I have reviewed the triage vital signs and the nursing notes.  Pertinent labs & imaging results that were available during my care of the patient were reviewed by me and considered in my medical decision making (see chart for details).     Declined chest x-ray due to wait for the test.  Exam and history are consistent with a COPD exacerbation and sinusitis.  Will treat with Depo-Medrol IM now.  Azithromycin 500 mg daily for 5 days.  Continue hand-held nebulizer treatments at home.  Provided fluconazole 150 mg now and repeat in a week if needed for vaginal yeast infection.  Given the injection of Depo-Medrol during the visit.  The patient has diabetes.  Advised to monitor her blood glucose levels as they will most assuredly increase for several days.  Follow-up here or with PCP if symptoms do not significantly improve in 2 weeks return sooner as needed. Final Clinical Impressions(s) / UC Diagnoses   Final diagnoses:  Acute cough  COPD exacerbation (HCC)  Chronic sinusitis, unspecified location     Discharge Instructions      Exam is consistent with COPD exacerbation and sinusitis.  Chest x-ray  had been scheduled but there was a delay in the patient preferred not to have the chest x-ray.  The patient has diabetes and COPD and has problems with many medication allergies that are anaphylactic.  She can take azithromycin.  Will take azithromycin 500 mg daily for 5 days.  Fluconazole, 150 mg 1 now and 1 in a week if needed for vaginal yeast infection after antibiotic and prednisone use.    Depo-Medrol injection for her COPD exacerbation.  Advised to monitor her blood glucose levels as the steroid could really raise her sugars.  Follow-up here or with her family doctor if she is not significantly improved in 2 weeks return sooner if needed.     ED Prescriptions     Medication Sig Dispense Auth. Provider   azithromycin (ZITHROMAX) 500 MG tablet Take 1 tablet (500 mg total) by mouth daily for 5 days. 5 tablet Prescilla Sours, FNP   fluconazole (DIFLUCAN) 150 MG tablet By mouth today and repeat in 5-7 days for vaginal yeast infection. 2 tablet Prescilla Sours, FNP      PDMP not reviewed this encounter.   Prescilla Sours, FNP 11/01/23 [phone]

## 2023-11-01 NOTE — ED Triage Notes (Signed)
 Pt presents with cough, SOB, nasal congestion, headache and rib cage pain from coughing started yesterday. Pt has a history of COPD. She has been using her nebulizer to help with symptoms.

## 2023-11-06 ENCOUNTER — Ambulatory Visit (INDEPENDENT_AMBULATORY_CARE_PROVIDER_SITE_OTHER): Admitting: Podiatry

## 2023-11-06 DIAGNOSIS — L84 Corns and callosities: Secondary | ICD-10-CM

## 2023-11-06 DIAGNOSIS — M205X1 Other deformities of toe(s) (acquired), right foot: Secondary | ICD-10-CM | POA: Diagnosis not present

## 2023-11-06 NOTE — Progress Notes (Signed)
 Chief Complaint  Patient presents with   Callouses    Right 5th toe on the medial side at the base of the toe, the callous is thick and there is a cut in it. Last A1c was six months ago, it was 6.8 and she does not take anti coag.   HPI: 68 y.o. female presents today with recurrence of the fourth interspace corn on the right foot.  Patient had tried a toe spacers and states that she likes the single loop toe spacer better than the double buddy loop toe spacer.  She notes that it is fairly tender today.  Denies any drainage.  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 Added automatically from request for surgery [id number]   Chronic respiratory failure with hypoxia (HCC) 01/09/2021   Gastroesophageal reflux disease without esophagitis 05/05/2017   History of MI (myocardial infarction) 03/07/2021   Formatting of this note might be different from the original. living in [place], never cathed (early 40s ~) patient reports MD said "light heart attack"   Mixed hyperlipidemia 10/29/2017   OSA (obstructive sleep apnea) 01/09/2021   Posttraumatic stress disorder 10/15/2016    Past Surgical History:  Procedure Laterality Date   APPENDECTOMY  1970   CERVICAL SPINE SURGERY  2020   CHOLECYSTECTOMY  1970   ELBOW SURGERY Right 1990   PARTIAL HYSTERECTOMY  1990    Allergies  Allergen Reactions   Amoxicillin-Pot Clavulanate Hives    Took benadryl and symptoms resolved in 2 days. No angioedema.   Doxycycline Anaphylaxis   Levofloxacin Itching and Rash   Ceftriaxone Other (See Comments)    coughing, difficulty swallowing, rash - unclear if caused by ceftriaxone - has tolerated cefazolin and cephalexin previously   Acetaminophen    Clavulanic Acid     Nitrofurantoin Other (See Comments), Swelling and Hives    Required hospitalization   Oxycodone-Acetaminophen Other (See Comments)    Auditory hallucinations, unable to sleep   Tape Dermatitis    Paper tape is fine   Sulfamethoxazole-Trimethoprim Rash     Physical Exam: Palpable pedal pulses noted.  There is a hyperkeratotic lesion in the right fourth interspace sulcus.  There is no evidence of ulceration.  No surrounding erythema.  There is pain on palpation of the lesion.  There is adductovarus deformity of the right fifth toe.  No edema is noted  Assessment/Plan of Care: 1. Corns   2. Adductovarus rotation of toe, acquired, right     Discussed clinical findings with patient today.  After softening the lesion with 3-WEA solution, the hyperkeratotic lesion was shaved with a sterile #88 blade uneventfully.  Patient noted immediate improvement in symptoms after this was performed.  She will continue with her single loop toe spacer.  Discussed proceeding with surgery to resolve this issue from repeatedly occurring.  Would recommend an arthroplasty to the right fifth toe with partial syndactylization of the fourth interspace.  Follow-up as needed   Clerance Lav, DPM, FACFAS [facility]     [address]  [place], [place] [address]                Office [phone]  Fax [phone]

## 2023-12-07 ENCOUNTER — Ambulatory Visit (INDEPENDENT_AMBULATORY_CARE_PROVIDER_SITE_OTHER)
Admission: RE | Admit: 2023-12-07 | Discharge: 2023-12-07 | Disposition: A | Discharge status: Home / Self Care | Source: Ambulatory Visit | Attending: Student | Admitting: Student

## 2023-12-07 ENCOUNTER — Telehealth (HOSPITAL_BASED_OUTPATIENT_CLINIC_OR_DEPARTMENT_OTHER): Payer: Self-pay | Admitting: Student

## 2023-12-07 ENCOUNTER — Ambulatory Visit (HOSPITAL_BASED_OUTPATIENT_CLINIC_OR_DEPARTMENT_OTHER): Admitting: Student

## 2023-12-07 ENCOUNTER — Encounter (HOSPITAL_BASED_OUTPATIENT_CLINIC_OR_DEPARTMENT_OTHER): Payer: Self-pay | Admitting: Student

## 2023-12-07 VITALS — BP 152/83 | HR 87 | Temp 98.4°F | Resp 18 | Ht 63.58 in | Wt 179.5 lb

## 2023-12-07 DIAGNOSIS — Z720 Tobacco use: Secondary | ICD-10-CM

## 2023-12-07 DIAGNOSIS — E118 Type 2 diabetes mellitus with unspecified complications: Secondary | ICD-10-CM

## 2023-12-07 DIAGNOSIS — R0789 Other chest pain: Secondary | ICD-10-CM | POA: Insufficient documentation

## 2023-12-07 DIAGNOSIS — R002 Palpitations: Secondary | ICD-10-CM | POA: Insufficient documentation

## 2023-12-07 DIAGNOSIS — F410 Panic disorder [episodic paroxysmal anxiety] without agoraphobia: Secondary | ICD-10-CM | POA: Insufficient documentation

## 2023-12-07 DIAGNOSIS — E782 Mixed hyperlipidemia: Secondary | ICD-10-CM

## 2023-12-07 DIAGNOSIS — L03114 Cellulitis of left upper limb: Secondary | ICD-10-CM | POA: Insufficient documentation

## 2023-12-07 DIAGNOSIS — J302 Other seasonal allergic rhinitis: Secondary | ICD-10-CM | POA: Insufficient documentation

## 2023-12-07 DIAGNOSIS — J449 Chronic obstructive pulmonary disease, unspecified: Secondary | ICD-10-CM

## 2023-12-07 DIAGNOSIS — Z7689 Persons encountering health services in other specified circumstances: Secondary | ICD-10-CM

## 2023-12-07 DIAGNOSIS — F17219 Nicotine dependence, cigarettes, with unspecified nicotine-induced disorders: Secondary | ICD-10-CM

## 2023-12-07 DIAGNOSIS — I252 Old myocardial infarction: Secondary | ICD-10-CM

## 2023-12-07 LAB — COMPREHENSIVE METABOLIC PANEL WITH GFR
ALT: 11 IU/L (ref 0–32)
AST: 15 IU/L (ref 0–40)
Albumin: 4.3 g/dL (ref 3.9–4.9)
Alkaline Phosphatase: 111 IU/L (ref 44–121)
BUN/Creatinine Ratio: 13 (ref 12–28)
BUN: 12 mg/dL (ref 8–27)
Bilirubin Total: 0.4 mg/dL (ref 0.0–1.2)
CO2: 26 mmol/L (ref 20–29)
Calcium: 9.7 mg/dL (ref 8.7–10.3)
Chloride: 101 mmol/L (ref 96–106)
Creatinine, Ser: 0.91 mg/dL (ref 0.57–1.00)
Globulin, Total: 2.2 g/dL (ref 1.5–4.5)
Glucose: 91 mg/dL (ref 70–99)
Potassium: 4.4 mmol/L (ref 3.5–5.2)
Sodium: 139 mmol/L (ref 134–144)
Total Protein: 6.5 g/dL (ref 6.0–8.5)
eGFR: 69 mL/min/{1.73_m2} (ref >59)

## 2023-12-07 LAB — CBC WITH DIFFERENTIAL/PLATELET
Basophils Absolute: 0 10*3/uL (ref 0.0–0.2)
Basos: 0 %
EOS (ABSOLUTE): 0.1 10*3/uL (ref 0.0–0.4)
Eos: 1 %
Hematocrit: 43.5 % (ref 34.0–46.6)
Hemoglobin: 14.4 g/dL (ref 11.1–15.9)
Immature Grans (Abs): 0 10*3/uL (ref 0.0–0.1)
Immature Granulocytes: 0 %
Lymphocytes Absolute: 3.6 10*3/uL — ABNORMAL HIGH (ref 0.7–3.1)
Lymphs: 30 %
MCH: 30.6 pg (ref 26.6–33.0)
MCHC: 33.1 g/dL (ref 31.5–35.7)
MCV: 92 fL (ref 79–97)
Monocytes Absolute: 0.9 10*3/uL (ref 0.1–0.9)
Monocytes: 7 %
Neutrophils Absolute: 7.5 10*3/uL — ABNORMAL HIGH (ref 1.4–7.0)
Neutrophils: 62 %
Platelets: 206 10*3/uL (ref 150–450)
RBC: 4.71 x10E6/uL (ref 3.77–5.28)
RDW: 12 % (ref 11.7–15.4)
WBC: 12.1 10*3/uL — ABNORMAL HIGH (ref 3.4–10.8)

## 2023-12-07 LAB — MAGNESIUM: Magnesium: 2.1 mg/dL (ref 1.6–2.3)

## 2023-12-07 LAB — TROPONIN T: Troponin T (Highly Sensitive): 21 ng/L (ref 0–14)

## 2023-12-07 MED ORDER — BUSPIRONE HCL 7.5 MG PO TABS: 7.5000 mg | ORAL_TABLET | Freq: Two times a day (BID) | ORAL | 5 refills | Status: DC

## 2023-12-07 MED ORDER — CEPHALEXIN 500 MG PO CAPS: 500.0000 mg | ORAL_CAPSULE | Freq: Four times a day (QID) | ORAL | 0 refills | Status: AC

## 2023-12-07 MED ORDER — DIAZEPAM 5 MG PO TABS: 5.0000 mg | ORAL_TABLET | Freq: Four times a day (QID) | ORAL | 0 refills | Status: DC | PRN

## 2023-12-07 MED ORDER — CETIRIZINE HCL 10 MG PO TABS: 10.0000 mg | ORAL_TABLET | Freq: Every day | ORAL | 11 refills | Status: DC

## 2023-12-07 NOTE — Assessment & Plan Note (Signed)
 Long history of smoking since age [age]. Currently reduced to one pack every two days. Acknowledges the need to quit smoking. - Continue efforts to reduce smoking - Provide support and resources for smoking cessation

## 2023-12-07 NOTE — Assessment & Plan Note (Addendum)
 Patient reports occasional chest tightness and palpitations-stating that this is present today.  O2 saturation within normal limits.  Previous myocardial infarction noting that she had chest pain radiating into her left arm per patient. - EKG normal today - Order stat labs to assess cardiac function - Order stat chest x-ray to assess cardiac silhouette and presence of cardiopulmonary disease.   - Patient was directed to ER as below will hold off on ER evaluation before sending in any sort of referral to cardio.

## 2023-12-07 NOTE — Assessment & Plan Note (Signed)
 COPD is managed with inhalers and nebulizer treatments. She reports shortness of breath and occasional chest tightness, likely exacerbated by weather changes. Oxygen  saturation was 95% during the visit. - Continue current inhaler regimen including Stiolto and rescue inhaler - Use nebulizer twice daily - Administer albuterol  inhaler as needed for acute symptoms - Consider allergy medication such as Zyrtec for potential allergy-related symptoms - Perform EKG to assess heart function due to reported chest tightness and palpitations

## 2023-12-07 NOTE — Assessment & Plan Note (Signed)
 Diabetes is managed with dietary modifications. No recent laboratory tests to assess glycemic control. POC glucose WNL. - Schedule follow-up appointment for fasting labs to assess glycemic control

## 2023-12-07 NOTE — Assessment & Plan Note (Signed)
 Stable on lipitor- continue current regimen.

## 2023-12-07 NOTE — Assessment & Plan Note (Signed)
 Anxiety is managed with diazepam  as needed. Concerns about long-term use of benzodiazepines include risk of dementia and falls. Family history of dementia. She prefers to continue diazepam  for acute anxiety episodes but is open to tapering off with appropriate alternatives. Discussed the addictive nature of benzodiazepines and the increased risk of dementia, especially given her family history. A gradual tapering plan was agreed upon to minimize risks. - Prescribe diazepam  5 mg, instruct to take half tablet as needed - Initiate Buspar 7.5 mg twice daily for long-term anxiety management - Plan to taper diazepam  gradually over the next few months - Schedule follow-up in 6 weeks to assess response to Buspar

## 2023-12-07 NOTE — Assessment & Plan Note (Signed)
 Cellulitis likely secondary to chicken scratch. Presents with swelling and redness on the left arm. No prior history of cellulitis. - Prescribe antibiotics for cellulitis - Instruct to monitor for expansion of redness and seek follow-up if symptoms worsen or do not improve within a week

## 2023-12-07 NOTE — Telephone Encounter (Unsigned)
 Copied from CRM [id number]. Topic: Clinical - Medical Advice >> Dec 07, 2023  4:31 PM Baldomero Bone wrote: Reason for CRM: Patient's daughter, Abe Abed is calling to see if the NP called [facility] to advise what the patient is experiencing as stated. Callback number is [phone]

## 2023-12-07 NOTE — Assessment & Plan Note (Signed)
 History of the same, patient would like a prescription for zyrtec- will send in.

## 2023-12-07 NOTE — Progress Notes (Signed)
 New Patient Office Visit  Subjective    Patient ID: Kaitlyn Rosales, female    DOB: [birth year]  Age: 68 y.o. MRN: [id number]  CC:  Chief Complaint  Patient presents with   Establish Care    Here to establish care.   Pain    Hurt left arm 1 week ago while feeding chickens and they scratched her. Been washing it with normal saline and cleaning it with a salve.    Discussed the use of AI scribe software for clinical note transcription with the patient, who gave verbal consent to proceed.  History of Present Illness   Kaitlyn Rosales is a 68 year old female who presents with a left arm injury and to establish care.  She has a left arm injury after being scratched by chickens, resulting in swelling and redness. She cleaned the area with saline but notes persistent symptoms, raising concern for cellulitis.  She has a history of anxiety, managed with diazepam  5 mg as needed, typically taking half a tablet due to its sedative effects. She uses diazepam  infrequently, sometimes going two to three months between refills. Her previous provider wanted to change her medication, which she resisted due to satisfaction with her current regimen. Anxiety exacerbations occur, particularly when dealing with health issues.  She has COPD and follows with a pulmonologist in [place]. She uses a nebulizer twice daily and is on Stiolto and a rescue inhaler. She experiences occasional shortness of breath and chest tightness, which she attributes to her COPD. She has a history of smoking since age 75 or 69 and is currently trying to quit, reducing to one pack every two days.  She has a history of hypertension, previously managed with medication, but was discontinued due to hypotension. She experiences occasional palpitations and chest tightness, with a noted heart rate of 125 bpm during an episode. She uses a pulse oximeter at home to monitor her condition.  She has a history of a heart attack, characterized by  chest pain radiating to her left arm, and is currently on Lipitor for cholesterol management. She manages her diabetes with diet and has not had recent lab work.  Family history is significant for dementia, with her mother and two sisters affected. She expresses concern about the potential cognitive effects of her medications.   Patient felt bad during the examination feeling as though her heart was racing and with tightening in her chest.  EKG: normal EKG, normal sinus rhythm.    Screenings:  Colon Cancer: deferred Lung Cancer: deferred Breast Cancer: deferred Diabetes: diet controlled HLD: indicated on lipitor  The 10-year ASCVD risk score (Arnett DK, et al., 2019) is: 28.6%  There are other unrelated non-urgent complaints, but due to the busy schedule and the amount of time I've already spent with her, time does not permit me to address these routine issues at today's visit. I've requested another appointment to review these additional issues.  Outpatient Encounter Medications as of 12/07/2023  Medication Sig   albuterol  (VENTOLIN  HFA) 108 (90 Base) MCG/ACT inhaler Inhale 1-2 puffs into the lungs every 6 (six) hours as needed.   atorvastatin (LIPITOR) 40 MG tablet Take 1 tablet by mouth daily.   Blood Glucose Monitoring Suppl (ACCU-CHEK GUIDE ME) w/Device KIT by Does not apply route.   cephALEXin (KEFLEX) 500 MG capsule Take 1 capsule (500 mg total) by mouth 4 (four) times daily for 5 days.   cetirizine (ZYRTEC) 10 MG tablet Take 1 tablet (10 mg total) by mouth  daily.   Cholecalciferol (VITAMIN D3) 250 MCG (10000 UT) TABS Take 10,000 Units by mouth daily.   cyclobenzaprine (FLEXERIL) 5 MG tablet Take 1 tablet by mouth 3 (three) times daily as needed.   diazepam  (VALIUM ) 5 MG tablet Take 1 tablet (5 mg total) by mouth every 6 (six) hours as needed for anxiety.   glucose blood (ACCU-CHEK GUIDE TEST) test strip 1 each by Other route as needed for other. Use as instructed   guaiFENesin  (MUCINEX) 600 MG 12 hr tablet Take 1,200 mg by mouth as needed.   ipratropium-albuterol  (DUONEB) 0.5-2.5 (3) MG/3ML SOLN Inhale 3 mLs into the lungs every 12 (twelve) hours.   meclizine (ANTIVERT) 25 MG tablet Take by mouth.   meclizine (ANTIVERT) 25 MG tablet Take 25 mg by mouth every 6 (six) hours.   mupirocin ointment (BACTROBAN) 2 % Apply 1 Application topically 2 (two) times daily.   omeprazole (PRILOSEC) 20 MG capsule Take 20 mg by mouth daily.   ondansetron  (ZOFRAN -ODT) 4 MG disintegrating tablet Take 4 mg by mouth every 8 (eight) hours as needed.   OXYGEN  2 L by Does not apply route at bedtime.   STIOLTO RESPIMAT  2.5-2.5 MCG/ACT AERS Take 2 Inhalations by mouth once.   [DISCONTINUED] diazepam  (VALIUM ) 5 MG tablet Take one tablet daily for anxiety and panic attacks as needed   busPIRone (BUSPAR) 7.5 MG tablet Take 1 tablet (7.5 mg total) by mouth 2 (two) times daily.   [DISCONTINUED] albuterol  (PROVENTIL ) (2.5 MG/3ML) 0.083% nebulizer solution USE 1 VIAL PER NEBULIZER EVERY 6 HOURS AS NEEDED FOR WHEEZING (Patient not taking: Reported on 12/07/2023)   [DISCONTINUED] Blood Glucose Monitoring Suppl (ONE TOUCH ULTRA 2) w/Device KIT 1 each by Does not apply route daily. (Patient not taking: Reported on 12/07/2023)   [DISCONTINUED] Cholecalciferol 25 MCG (1000 UT) tablet Take by mouth. (Patient not taking: Reported on 12/07/2023)   [DISCONTINUED] cyclobenzaprine (FLEXERIL) 5 MG tablet Take 5 mg by mouth 3 (three) times daily. (Patient not taking: Reported on 12/07/2023)   [DISCONTINUED] diazepam  (VALIUM ) 5 MG tablet Take 1 tablet by mouth daily. (Patient not taking: Reported on 12/07/2023)   [DISCONTINUED] ferrous sulfate 325 (65 FE) MG tablet Take by mouth. (Patient not taking: Reported on 12/07/2023)   [DISCONTINUED] fluconazole  (DIFLUCAN ) 150 MG tablet By mouth today and repeat in 5-7 days for vaginal yeast infection. (Patient not taking: Reported on 12/07/2023)   [DISCONTINUED] glucose blood test  strip Use as instructed (Patient not taking: Reported on 12/07/2023)   [DISCONTINUED] omeprazole (PRILOSEC) 20 MG capsule Take 20 mg by mouth as needed. (Patient not taking: Reported on 12/07/2023)   [DISCONTINUED] ondansetron  (ZOFRAN ) 8 MG tablet Take by mouth. (Patient not taking: Reported on 12/07/2023)   No facility-administered encounter medications on file as of 12/07/2023.    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 Added automatically from request for surgery [id number]   Chronic respiratory failure with hypoxia (HCC) 01/09/2021   Gastroesophageal reflux disease without esophagitis 05/05/2017   History of MI (myocardial infarction) 03/07/2021   Formatting of this note might be different from the original. living in [place], never cathed (early 40s ~) patient reports MD said "light heart attack"   Mixed hyperlipidemia 10/29/2017   OSA (obstructive sleep apnea) 01/09/2021  Posttraumatic stress disorder 10/15/2016    Past Surgical History:  Procedure Laterality Date   APPENDECTOMY  1970   CERVICAL SPINE SURGERY  2020   CHOLECYSTECTOMY  1970   ELBOW SURGERY Right 1990   PARTIAL HYSTERECTOMY  1990    Family History  Problem Relation Age of Onset   Dementia Mother    Parkinson's disease Mother    Cirrhosis Father    Osteoarthritis Sister    Breast cancer Sister    COPD Sister    COPD Brother     Social History   Socioeconomic History   Marital status: Legally Separated    Spouse name: Not on file   Number of children: 2   Years of education: Not on file   Highest education level: High school graduate  Occupational History   Occupation: Disabled  Tobacco Use   Smoking status: Every Day    Current packs/day: 0.50    Average  packs/day: 0.5 packs/day for 57.3 years (28.7 ttl pk-yrs)    Types: Cigarettes    Start date: 1968   Smokeless tobacco: Never  Vaping Use   Vaping status: Never Used  Substance and Sexual Activity   Alcohol use: Not Currently   Drug use: Never   Sexual activity: Yes    Partners: Male  Other Topics Concern   Not on file  Social History Narrative   2 children living, 1 child passed away.   Social Drivers of Corporate investment banker Strain: Not on file  Food Insecurity: No Food Insecurity (12/07/2023)   Hunger Vital Sign    Worried About Running Out of Food in the Last Year: Never true    Ran Out of Food in the Last Year: Never true  Transportation Needs: No Transportation Needs (12/07/2023)   PRAPARE - Administrator, Civil Service (Medical): No    Lack of Transportation (Non-Medical): No  Physical Activity: Not on file  Stress: Not on file  Social Connections: Not on file  Intimate Partner Violence: Not At Risk (12/07/2023)   Humiliation, Afraid, Rape, and Kick questionnaire    Fear of Current or Ex-Partner: No    Emotionally Abused: No    Physically Abused: No    Sexually Abused: No    ROS  Per HPI      Objective    BP (!) 152/83   Pulse 87   Temp 98.4 F (36.9 C) (Oral)   Resp 18   Ht 5' 3.58" (1.615 m)   Wt 179 lb 8 oz (81.4 kg)   LMP  (LMP Unknown)   SpO2 93%   BMI 31.22 kg/m   Physical Exam Constitutional:      General: She is not in acute distress.    Appearance: Normal appearance. She is not ill-appearing.  HENT:     Head: Normocephalic and atraumatic.     Right Ear: External ear normal.     Left Ear: External ear normal.     Nose: Nose normal.     Mouth/Throat:     Mouth: Mucous membranes are moist.     Pharynx: Oropharynx is clear.  Eyes:     General: No scleral icterus.    Extraocular Movements: Extraocular movements intact.     Conjunctiva/sclera: Conjunctivae normal.     Pupils: Pupils are equal, round, and reactive to  light.  Neck:     Vascular: No carotid bruit.  Cardiovascular:     Rate and Rhythm: Normal rate and regular rhythm.  Pulses: Normal pulses.     Heart sounds: Normal heart sounds. No murmur heard.    No friction rub.  Pulmonary:     Effort: Pulmonary effort is normal. No respiratory distress.     Breath sounds: Wheezing present. No rhonchi or rales.  Musculoskeletal:        General: Normal range of motion.     Cervical back: Neck supple.     Right lower leg: No edema.     Left lower leg: No edema.  Skin:    General: Skin is warm and dry.     Coloration: Skin is not jaundiced or pale.     Findings: Erythema and rash (large red area surrounding lesions. No pus or foul odor noted. Marker line placed.) present.  Neurological:     General: No focal deficit present.     Mental Status: She is alert.     Deep Tendon Reflexes: Reflexes normal.  Psychiatric:        Mood and Affect: Mood normal.        Behavior: Behavior normal.         Assessment & Plan:   Encounter to establish care  Panic attack Assessment & Plan: Anxiety is managed with diazepam  as needed. Concerns about long-term use of benzodiazepines include risk of dementia and falls. Family history of dementia. She prefers to continue diazepam  for acute anxiety episodes but is open to tapering off with appropriate alternatives. Discussed the addictive nature of benzodiazepines and the increased risk of dementia, especially given her family history. A gradual tapering plan was agreed upon to minimize risks. - Prescribe diazepam  5 mg, instruct to take half tablet as needed - Initiate Buspar 7.5 mg twice daily for long-term anxiety management - Plan to taper diazepam  gradually over the next few months - Schedule follow-up in 6 weeks to assess response to Buspar  Orders: -     diazePAM ; Take 1 tablet (5 mg total) by mouth every 6 (six) hours as needed for anxiety.  Dispense: 30 tablet; Refill: 0 -     busPIRone HCl; Take 1  tablet (7.5 mg total) by mouth 2 (two) times daily.  Dispense: 60 tablet; Refill: 5  Seasonal allergies Assessment & Plan: History of the same, patient would like a prescription for zyrtec- will send in.  Orders: -     Cetirizine HCl; Take 1 tablet (10 mg total) by mouth daily.  Dispense: 30 tablet; Refill: 11  Cellulitis of left upper arm Assessment & Plan: Cellulitis likely secondary to chicken scratch. Presents with swelling and redness on the left arm. No prior history of cellulitis. - Prescribe antibiotics for cellulitis - Instruct to monitor for expansion of redness and seek follow-up if symptoms worsen or do not improve within a week      Orders: -     CBC with Differential/Platelet -     Cephalexin; Take 1 capsule (500 mg total) by mouth 4 (four) times daily for 5 days.  Dispense: 20 capsule; Refill: 0  Controlled type 2 diabetes mellitus with complication, without long-term current use of insulin (HCC) Assessment & Plan: Diabetes is managed with dietary modifications. No recent laboratory tests to assess glycemic control. POC glucose WNL. - Schedule follow-up appointment for fasting labs to assess glycemic control   Cigarette nicotine dependence with nicotine-induced disorder Assessment & Plan: Long history of smoking since age 76-12. Currently reduced to one pack every two days. Acknowledges the need to quit smoking. - Continue efforts to reduce  smoking - Provide support and resources for smoking cessation   Tobacco abuse  History of MI (myocardial infarction) Assessment & Plan: She reports occasional chest tightness and palpitations. No current cardiologist follow-up. Previous myocardial infarction symptoms included chest pain radiating to the left arm. Discussed the importance of monitoring heart function due to these symptoms. - Perform EKG to assess current heart function - Consider referral to cardiologist for further evaluation if EKG indicates  abnormalities   Mixed hyperlipidemia Assessment & Plan: Stable on lipitor- continue current regimen.   Palpitations -     CBC with Differential/Platelet -     Comprehensive metabolic panel with GFR -     Troponin T -     Magnesium -     DG Chest 2 View; Future  Chronic obstructive pulmonary disease, unspecified COPD type (HCC) Assessment & Plan: COPD is managed with inhalers and nebulizer treatments. She reports shortness of breath and occasional chest tightness, likely exacerbated by weather changes. Oxygen  saturation was 95% during the visit. - Continue current inhaler regimen including Stiolto and rescue inhaler - Use nebulizer twice daily - Administer albuterol  inhaler as needed for acute symptoms - Consider allergy medication such as Zyrtec for potential allergy-related symptoms - Perform EKG to assess heart function due to reported chest tightness and palpitations  Orders: -     CBC with Differential/Platelet -     Comprehensive metabolic panel with GFR  Chest tightness Assessment & Plan: Patient reports occasional chest tightness and palpitations-stating that this is present today.  O2 saturation within normal limits.  Previous myocardial infarction noting that she had chest pain radiating into her left arm per patient. - EKG normal today - Order stat labs to assess cardiac function - Order stat chest x-ray to assess cardiac silhouette and presence of cardiopulmonary disease.   - Patient was directed to ER as below will hold off on ER evaluation before sending in any sort of referral to cardio.  Orders: -     CBC with Differential/Platelet -     Comprehensive metabolic panel with GFR -     Troponin T -     Magnesium -     EKG 12-Lead -     EKG 12-Lead -     DG Chest 2 View; Future  Update: Elevated Troponin result was called to patient and she was advised to be checked out in the ER due to atypical chest symptoms. Normal CXR. May be heart strain in setting of  COPD but patient was directed to hospital out of an abundance of caution. Patient notes that she will go to the [facility] ED.  Return in about 6 weeks (around 01/18/2024) for Chronic Followup, HTN, DM.   Derwood Flor T Emiyah Spraggins, [name]

## 2023-12-07 NOTE — Patient Instructions (Addendum)
 It was nice to see you today!  As we discussed in clinic:  Start zyrtec over the counter.  Start the buspar twice dialy and let me know how it is going in about 6 weeks.  If you have any problems before your next visit feel free to message me via MyChart (minor issues or questions) or call the office, otherwise you may reach out to schedule an office visit.  Thank you! Makenly Larabee, [name]

## 2023-12-07 NOTE — Assessment & Plan Note (Signed)
 She reports occasional chest tightness and palpitations. No current cardiologist follow-up. Previous myocardial infarction symptoms included chest pain radiating to the left arm. Discussed the importance of monitoring heart function due to these symptoms. - Perform EKG to assess current heart function - Consider referral to cardiologist for further evaluation if EKG indicates abnormalities

## 2023-12-08 ENCOUNTER — Telehealth (HOSPITAL_BASED_OUTPATIENT_CLINIC_OR_DEPARTMENT_OTHER): Payer: Self-pay

## 2023-12-08 NOTE — Telephone Encounter (Signed)
 Spoke to Daughter Abe Abed and she said pt is back home. They were going to mail her a heart monitor patch. We cannot see the notes. Pt will come by to sign a release form.  Copied from CRM [id number]. Topic: Clinical - Medical Advice >> Dec 07, 2023  4:31 PM Baldomero Bone wrote: Reason for CRM: Patient's daughter, Abe Abed is calling to see if the NP called [facility] to advise what the patient is experiencing as stated. Callback number is [phone]

## 2023-12-24 DIAGNOSIS — J449 Chronic obstructive pulmonary disease, unspecified: Secondary | ICD-10-CM | POA: Diagnosis not present

## 2023-12-28 DIAGNOSIS — R002 Palpitations: Secondary | ICD-10-CM | POA: Diagnosis not present

## 2024-01-06 DIAGNOSIS — R002 Palpitations: Secondary | ICD-10-CM | POA: Diagnosis not present

## 2024-01-12 DIAGNOSIS — K219 Gastro-esophageal reflux disease without esophagitis: Secondary | ICD-10-CM | POA: Diagnosis not present

## 2024-01-12 DIAGNOSIS — F17210 Nicotine dependence, cigarettes, uncomplicated: Secondary | ICD-10-CM | POA: Diagnosis not present

## 2024-01-12 DIAGNOSIS — G4733 Obstructive sleep apnea (adult) (pediatric): Secondary | ICD-10-CM | POA: Diagnosis not present

## 2024-01-12 DIAGNOSIS — J449 Chronic obstructive pulmonary disease, unspecified: Secondary | ICD-10-CM | POA: Diagnosis not present

## 2024-01-18 ENCOUNTER — Encounter (HOSPITAL_BASED_OUTPATIENT_CLINIC_OR_DEPARTMENT_OTHER): Payer: Self-pay | Admitting: Student

## 2024-01-18 ENCOUNTER — Ambulatory Visit (HOSPITAL_BASED_OUTPATIENT_CLINIC_OR_DEPARTMENT_OTHER): Admitting: Student

## 2024-01-18 ENCOUNTER — Ambulatory Visit (INDEPENDENT_AMBULATORY_CARE_PROVIDER_SITE_OTHER)
Admission: RE | Admit: 2024-01-18 | Discharge: 2024-01-18 | Disposition: A | Discharge status: Home / Self Care | Source: Ambulatory Visit | Attending: Student | Admitting: Student

## 2024-01-18 VITALS — BP 151/82 | HR 82 | Temp 97.9°F | Resp 16 | Ht 63.5 in | Wt 182.3 lb

## 2024-01-18 DIAGNOSIS — Z13220 Encounter for screening for lipoid disorders: Secondary | ICD-10-CM | POA: Diagnosis not present

## 2024-01-18 DIAGNOSIS — Z136 Encounter for screening for cardiovascular disorders: Secondary | ICD-10-CM | POA: Diagnosis not present

## 2024-01-18 DIAGNOSIS — J441 Chronic obstructive pulmonary disease with (acute) exacerbation: Secondary | ICD-10-CM

## 2024-01-18 DIAGNOSIS — M8580 Other specified disorders of bone density and structure, unspecified site: Secondary | ICD-10-CM | POA: Diagnosis not present

## 2024-01-18 DIAGNOSIS — D692 Other nonthrombocytopenic purpura: Secondary | ICD-10-CM

## 2024-01-18 DIAGNOSIS — I10 Essential (primary) hypertension: Secondary | ICD-10-CM

## 2024-01-18 DIAGNOSIS — E118 Type 2 diabetes mellitus with unspecified complications: Secondary | ICD-10-CM | POA: Diagnosis not present

## 2024-01-18 DIAGNOSIS — Z87891 Personal history of nicotine dependence: Secondary | ICD-10-CM | POA: Diagnosis not present

## 2024-01-18 DIAGNOSIS — R059 Cough, unspecified: Secondary | ICD-10-CM | POA: Diagnosis not present

## 2024-01-18 DIAGNOSIS — F17210 Nicotine dependence, cigarettes, uncomplicated: Secondary | ICD-10-CM

## 2024-01-18 MED ORDER — CIPROFLOXACIN HCL 750 MG PO TABS
750.0000 mg | ORAL_TABLET | Freq: Two times a day (BID) | ORAL | 0 refills | Status: AC
Start: 2024-01-18 — End: 2024-01-28

## 2024-01-18 MED ORDER — DEXAMETHASONE SODIUM PHOSPHATE 10 MG/ML IJ SOLN
20.0000 mg | Freq: Once | INTRAMUSCULAR | Status: AC
  Administered 2024-01-18: 20 mg via INTRAMUSCULAR

## 2024-01-18 MED ORDER — ACCU-CHEK GUIDE TEST VI STRP
1.0000 | ORAL_STRIP | 6 refills | Status: AC | PRN
  Filled 2024-06-20: qty 100, 34d supply, fill #0
  Filled 2024-07-21 (×2): qty 100, 34d supply, fill #1
  Filled 2024-09-13: qty 100, 34d supply, fill #2

## 2024-01-18 NOTE — Patient Instructions (Signed)
 It was nice to see you today!  If you have any problems before your next visit feel free to message me via MyChart (minor issues or questions) or call the office, otherwise you may reach out to schedule an office visit.  Thank you! Pau Banh, [name]

## 2024-01-18 NOTE — Assessment & Plan Note (Signed)
 Chronic issue with acute exacerbation.  COPD with recent exacerbation and productive cough with dark brown sputum. Uses Stiolto as maintenance inhaler. Allergic to multiple antibiotics; ciprofloxacin  prescribed with caution due to allergy history and potential side effects in older adults. CT scan of lungs is being arranged by pulmonologist. Patient cannot take oral steroids (or at least an oral prednisone ).  May try methylprednisolone  as she is still having issues in around a week. - Administer steroid injection - Prescribe ciprofloxacin  with caution due to levofloxacin allergy.  It appears from records review that she has tolerated ciprofloxacin  in the past.  Discussed with patient. - Order chest x-ray.  My own interpretation of the chest x-ray concludes that it is unchanged from chest x-ray from around a month ago-no signs of pneumonia.  We will call and update patient on this.  Still suspecting COPD exacerbation. - Ensure availability of albuterol  inhaler

## 2024-01-18 NOTE — Progress Notes (Signed)
 Established Patient Office Visit  Subjective   Patient ID: Kaitlyn Rosales, female    DOB: 1955-09-27  Age: 68 y.o. MRN: [id number]  Chief Complaint  Patient presents with   Medical Management of Chronic Issues    Here for follow up.    Foot    Has a bump on bottom of left foot. Hurting to walk. Also thinks some ants bit her on right leg.   Cough    Has had a cough for 2 weeks. Thinks she has bronchitis. [employer] as not checked O2 tank in 9 years.    Discussed the use of AI scribe software for clinical note transcription with the patient, who gave verbal consent to proceed.  History of Present Illness   Kaitlyn Rosales is a 68 year old female with diabetes and COPD who presents with worsening cough and worsening sputum production.  She had been experiencing chest pain and palpitations, which led to a hospital visit. An EKG was performed, and she was informed that the pain was due to a pulled muscle in her chest. She wore a Holter monitor and completed, but the results are pending. She is uncertain if she consulted a cardiologist or an ER doctor during her visit.  Her diabetes is managed with diet, and she describes her control as 'pretty good'. She needs more test strips for monitoring and has not had any recent lab work since this is only her second visit to the current provider.  She has a history of COPD and reports a worsening cough over the past couple of weeks, with sputum changing from clear to dark brown and greenish. No hemoptysis, but she notes epistaxis. She uses a nebulizer and a Stiolto inhaler for maintenance but recently lost her albuterol  inhaler. She is allergic to multiple antibiotics, including azithromycin , levofloxacin, and doxycycline , but has previously tolerated ciprofloxacin .  He is unsure if she tolerated a Medrol  Dosepak in the past, but she notes she typically does not tolerate oral steroids.  She has a history of cellulitis on her arm, which responded well to  antibiotics.  She reports a recent increase in blood pressure, which she attributes to stress from family issues, including the recent passing of her niece. She has a home blood pressure cuff and will monitor her levels.  Patient Active Problem List   Diagnosis Date Noted   Senile purpura (HCC) 01/18/2024   Osteopenia 01/18/2024   Primary hypertension 01/18/2024   Panic attack 12/07/2023   Seasonal allergies 12/07/2023   Palpitations 12/07/2023   Allergic reaction 08/04/2021   COPD with acute exacerbation (HCC) 08/03/2021   Lumbar spondylosis 04/09/2021   OAB (overactive bladder) 04/09/2021   COPD (chronic obstructive pulmonary disease) (HCC) 03/07/2021   History of MI (myocardial infarction) 03/07/2021   Hypovitaminosis D 03/07/2021   Chronic respiratory failure with hypoxia (HCC) 01/09/2021   OSA (obstructive sleep apnea) 01/09/2021   Smoking greater than 40 pack years 01/09/2021   Intracranial aneurysm 08/22/2019   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   Other dysphagia 03/01/2019   S/P cervical spinal fusion 02/05/2019   Cervical spinal stenosis 10/08/2018   High risk medication use 09/22/2018   Mixed hyperlipidemia 10/29/2017   Tobacco abuse 09/08/2017   Cigarette nicotine dependence with nicotine-induced disorder 09/08/2017   Controlled type 2 diabetes mellitus with complication, without long-term current use of insulin (HCC) 07/31/2017   Gastroesophageal reflux disease without esophagitis 05/05/2017   Posttraumatic stress disorder 10/15/2016   Past Medical  History:  Diagnosis Date   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 Added automatically from request for surgery [id number]   Chronic respiratory failure with hypoxia (HCC)  01/09/2021   Gastroesophageal reflux disease without esophagitis 05/05/2017   History of MI (myocardial infarction) 03/07/2021   Formatting of this note might be different from the original. living in [place], never cathed (early 40s ~) patient reports MD said "light heart attack"   Mixed hyperlipidemia 10/29/2017   OSA (obstructive sleep apnea) 01/09/2021   Posttraumatic stress disorder 10/15/2016   Social History   Tobacco Use   Smoking status: Every Day    Current packs/day: 0.50    Average packs/day: 0.5 packs/day for 57.4 years (28.7 ttl pk-yrs)    Types: Cigarettes    Start date: 1968    Passive exposure: Current   Smokeless tobacco: Never  Vaping Use   Vaping status: Never Used  Substance Use Topics   Alcohol use: Not Currently   Drug use: Never   Allergies  Allergen Reactions   Amoxicillin -Pot Clavulanate Hives    Took benadryl  and symptoms resolved in 2 days. No angioedema.   Azithromycin  Shortness Of Breath    Throat closing    Doxycycline  Anaphylaxis   Levofloxacin Itching and Rash   Ceftriaxone  Other (See Comments)    coughing, difficulty swallowing, rash - unclear if caused by ceftriaxone  - has tolerated cefazolin and cephalexin  previously   Acetaminophen    Clavulanic Acid    Nitrofurantoin Other (See Comments), Swelling and Hives    Required hospitalization   Oxycodone-Acetaminophen Other (See Comments)    Auditory hallucinations, unable to sleep   Prednisone  Swelling    CANNOT DO PILLS. Swelling in mouth.   Tape Dermatitis    Paper tape is fine   Trimethoprim    Sulfamethoxazole-Trimethoprim Rash      Review of Systems  Respiratory:  Positive for cough.    Per HPI.    Objective:     BP (!) 151/82   Pulse 82   Temp 97.9 F (36.6 C) (Oral)   Resp 16   Ht 5' 3.5" (1.613 m)   Wt 182 lb 4.8 oz (82.7 kg)   LMP  (LMP Unknown)   SpO2 96%   BMI 31.79 kg/m  BP Readings from Last 3 Encounters:  01/18/24 (!) 151/82  12/07/23 (!) 152/83  11/01/23 (!)  150/84   Wt Readings from Last 3 Encounters:  01/18/24 182 lb 4.8 oz (82.7 kg)  12/07/23 179 lb 8 oz (81.4 kg)  10/28/21 197 lb (89.4 kg)      Physical Exam Constitutional:      General: She is not in acute distress.    Appearance: Normal appearance. She is not ill-appearing.  HENT:     Head: Normocephalic and atraumatic.     Nose: Nose normal.  Eyes:     General: No scleral icterus.    Conjunctiva/sclera: Conjunctivae normal.  Cardiovascular:     Rate and Rhythm: Normal rate and regular rhythm.     Heart sounds: Normal heart sounds. No murmur heard.    No friction rub.  Pulmonary:     Effort: Pulmonary effort is normal. No respiratory distress.     Breath sounds: Wheezing (Generalized across all lobes) present. No rhonchi or rales.  Musculoskeletal:  General: Normal range of motion.  Skin:    General: Skin is warm and dry.     Coloration: Skin is not jaundiced or pale.     Comments: Senile purpura present  Neurological:     General: No focal deficit present.     Mental Status: She is alert.  Psychiatric:        Mood and Affect: Mood normal.        Behavior: Behavior normal.      No results found for any visits on 01/18/24.  Last CBC Lab Results  Component Value Date   WBC 12.1 (H) 12/07/2023   HGB 14.4 12/07/2023   HCT 43.5 12/07/2023   MCV 92 12/07/2023   MCH 30.6 12/07/2023   RDW 12.0 12/07/2023   PLT 206 12/07/2023   Last metabolic panel Lab Results  Component Value Date   GLUCOSE 91 12/07/2023   NA 139 12/07/2023   K 4.4 12/07/2023   CL 101 12/07/2023   CO2 26 12/07/2023   BUN 12 12/07/2023   CREATININE 0.91 12/07/2023   EGFR 69 12/07/2023   CALCIUM 9.7 12/07/2023   PROT 6.5 12/07/2023   ALBUMIN 4.3 12/07/2023   LABGLOB 2.2 12/07/2023   AGRATIO 1.6 10/28/2021   BILITOT 0.4 12/07/2023   ALKPHOS 111 12/07/2023   AST 15 12/07/2023   ALT 11 12/07/2023   Last lipids Lab Results  Component Value Date   CHOL 165 10/02/2021   HDL 53  10/02/2021   LDLCALC 89 10/02/2021   TRIG 128 10/02/2021   CHOLHDL 3.1 10/02/2021   Last hemoglobin A1c Lab Results  Component Value Date   HGBA1C 6.4 (H) 10/02/2021      The ASCVD Risk score (Arnett DK, et al., 2019) failed to calculate for the following reasons:   Risk score cannot be calculated because patient has a medical history suggesting prior/existing ASCVD    Assessment & Plan:   COPD with acute exacerbation (HCC) Assessment & Plan: Chronic issue with acute exacerbation.  COPD with recent exacerbation and productive cough with dark brown sputum. Uses Stiolto as maintenance inhaler. Allergic to multiple antibiotics; ciprofloxacin  prescribed with caution due to allergy history and potential side effects in older adults. CT scan of lungs is being arranged by pulmonologist. Patient cannot take oral steroids (or at least an oral prednisone ).  May try methylprednisolone  as she is still having issues in around a week. - Administer steroid injection - Prescribe ciprofloxacin  with caution due to levofloxacin allergy.  It appears from records review that she has tolerated ciprofloxacin  in the past.  Discussed with patient. - Order chest x-ray.  My own interpretation of the chest x-ray concludes that it is unchanged from chest x-ray from around a month ago-no signs of pneumonia.  We will call and update patient on this.  Still suspecting COPD exacerbation. - Ensure availability of albuterol  inhaler  Orders: -     DG Chest 2 View -     dexAMETHasone Sodium Phosphate -     Ciprofloxacin  HCl; Take 1 tablet (750 mg total) by mouth 2 (two) times daily for 10 days.  Dispense: 20 tablet; Refill: 0  Controlled type 2 diabetes mellitus with complication, without long-term current use of insulin (HCC) Assessment & Plan: Chronic, stable.  Diabetes is managed with dietary modifications. No recent laboratory tests to assess glycemic control.  Recent POC glucose WNL.  Will assess with A1c  today.  Orders: -     Accu-Chek Guide Test; 1 each by  Other route as needed (Use to check glucose 3 times daily.). Use as instructed  Dispense: 100 each; Refill: 6 -     Microalbumin / creatinine urine ratio -     Hemoglobin A1c  Senile purpura (HCC) Assessment & Plan: Apparent on examination. - not currently on blood thinners. - Avoid unnecessary aspirin in the future. - May discuss falls risk at next visit.   Osteopenia, unspecified location Assessment & Plan: Osteopenia with last DEXA scan in 2023. Prescribed Vitamin D3. Does not consume milk. Insurance may cover DEXA scan every two years. - Order DEXA scan - Ensure adequate vitamin D intake  Orders: -     DG Bone Density; Future  Encounter for lipid screening for cardiovascular disease -     Lipid panel  Primary hypertension Assessment & Plan: Chronic, stable.  Blood pressure elevated, possibly due to stress. Previous readings were also high. Has a home blood pressure cuff. - Instruct to maintain a blood pressure log - Advise on reducing sodium intake    Return in about 3 months (around 04/19/2024), or if symptoms worsen or fail to improve, for Annual Physical.    Laron Plummer Bryson Gavia, [name]

## 2024-01-18 NOTE — Assessment & Plan Note (Addendum)
 Chronic, stable.  Diabetes is managed with dietary modifications. No recent laboratory tests to assess glycemic control.  Recent POC glucose WNL.  Will assess with A1c today.

## 2024-01-18 NOTE — Assessment & Plan Note (Signed)
 Osteopenia with last DEXA scan in 2023. Prescribed Vitamin D3. Does not consume milk. Insurance may cover DEXA scan every two years. - Order DEXA scan - Ensure adequate vitamin D intake

## 2024-01-18 NOTE — Assessment & Plan Note (Signed)
 Apparent on examination. - not currently on blood thinners. - Avoid unnecessary aspirin in the future. - May discuss falls risk at next visit.

## 2024-01-18 NOTE — Assessment & Plan Note (Signed)
 Chronic, stable.  Blood pressure elevated, possibly due to stress. Previous readings were also high. Has a home blood pressure cuff. - Instruct to maintain a blood pressure log - Advise on reducing sodium intake

## 2024-01-19 ENCOUNTER — Ambulatory Visit (HOSPITAL_BASED_OUTPATIENT_CLINIC_OR_DEPARTMENT_OTHER): Payer: Self-pay | Admitting: Student

## 2024-01-19 LAB — LIPID PANEL
Chol/HDL Ratio: 2.3 ratio (ref 0.0–4.4)
Cholesterol, Total: 154 mg/dL (ref 100–199)
HDL: 67 mg/dL (ref >39)
LDL Chol Calc (NIH): 65 mg/dL (ref 0–99)
Triglycerides: 125 mg/dL (ref 0–149)
VLDL Cholesterol Cal: 22 mg/dL (ref 5–40)

## 2024-01-19 LAB — HEMOGLOBIN A1C
Est. average glucose Bld gHb Est-mCnc: 123 mg/dL
Hgb A1c MFr Bld: 5.9 % — ABNORMAL HIGH (ref 4.8–5.6)

## 2024-01-19 LAB — MICROALBUMIN / CREATININE URINE RATIO
Creatinine, Urine: 24.9 mg/dL
Microalb/Creat Ratio: <12 mg/g{creat} (ref 0–29)
Microalbumin, Urine: <3 ug/mL

## 2024-01-21 ENCOUNTER — Other Ambulatory Visit (HOSPITAL_BASED_OUTPATIENT_CLINIC_OR_DEPARTMENT_OTHER): Payer: Self-pay | Admitting: Student

## 2024-01-21 ENCOUNTER — Telehealth (HOSPITAL_BASED_OUTPATIENT_CLINIC_OR_DEPARTMENT_OTHER): Payer: Self-pay

## 2024-01-21 NOTE — Telephone Encounter (Signed)
 Pt was wondering what meds we gave her for shots and said BS is ranging 124-202. BS 123/609 & 136/77. Said the medicine she got is messing her up. Feels like throat is swollen, can't get enough to drink. Was not able to check O2. Is worried her Hgb A1c will go up. I advised pt meds we gave her are not on allergy list but will inform PCP.

## 2024-01-22 ENCOUNTER — Ambulatory Visit (INDEPENDENT_AMBULATORY_CARE_PROVIDER_SITE_OTHER): Admitting: Podiatry

## 2024-01-22 DIAGNOSIS — L84 Corns and callosities: Secondary | ICD-10-CM | POA: Diagnosis not present

## 2024-01-22 DIAGNOSIS — M79674 Pain in right toe(s): Secondary | ICD-10-CM | POA: Diagnosis not present

## 2024-01-22 DIAGNOSIS — B351 Tinea unguium: Secondary | ICD-10-CM | POA: Diagnosis not present

## 2024-01-22 DIAGNOSIS — E1151 Type 2 diabetes mellitus with diabetic peripheral angiopathy without gangrene: Secondary | ICD-10-CM

## 2024-01-22 DIAGNOSIS — M79675 Pain in left toe(s): Secondary | ICD-10-CM | POA: Diagnosis not present

## 2024-01-22 NOTE — Progress Notes (Unsigned)
 Subjective:  Patient ID: Kaitlyn Rosales, female    DOB: 1956/01/12,  MRN: [id number]  Kaitlyn Rosales presents to clinic today for:  Chief Complaint  Patient presents with   [facility]    [facility] with a callous/corn. Last A1c was 5.9, two days ago. No anti coag.    Patient notes nails are thick and elongated, causing pain in shoe gear when ambulating. She has corns on the 5th toes, right 4th interspace (which she states is much better), and a painful left submet 5 corn.   PCP is Rothfuss, Jacob T, [name].  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 Added automatically from request for surgery [id number]   Chronic respiratory failure with hypoxia (HCC) 01/09/2021   Gastroesophageal reflux disease without esophagitis 05/05/2017   History of MI (myocardial infarction) 03/07/2021   Formatting of this note might be different from the original. living in [place], never cathed (early 40s ~) patient reports MD said light heart attack   Mixed hyperlipidemia 10/29/2017   OSA (obstructive sleep apnea) 01/09/2021   Posttraumatic stress disorder 10/15/2016   Allergies  Allergen Reactions   Amoxicillin -Pot Clavulanate Hives    Took benadryl  and symptoms resolved in 2 days. No angioedema.   Azithromycin  Shortness Of Breath    Throat closing    Doxycycline  Anaphylaxis   Levofloxacin Itching and Rash   Ceftriaxone  Other (See Comments)    coughing, difficulty swallowing, rash - unclear if caused by ceftriaxone  - has tolerated cefazolin and cephalexin  previously   Acetaminophen    Clavulanic Acid    Nitrofurantoin Other (See Comments), Swelling and Hives    Required hospitalization   Oxycodone-Acetaminophen Other (See Comments)    Auditory  hallucinations, unable to sleep   Prednisone  Swelling    CANNOT DO PILLS. Swelling in mouth.   Tape Dermatitis    Paper tape is fine   Trimethoprim    Sulfamethoxazole-Trimethoprim Rash    Objective:  Kaitlyn Rosales is a pleasant 68 y.o. female in NAD. AAO x 3.  Vascular Examination: Patient has palpable DP pulse, absent PT pulse bilateral.  Delayed capillary refill bilateral toes.  Sparse digital hair bilateral.  Proximal to distal cooling WNL bilateral.    Dermatological Examination: Interspaces are clear with no open lesions noted bilateral.  Skin is shiny and atrophic bilateral.  Nails are 3-68mm thick, with yellowish/brown discoloration, subungual debris and distal onycholysis x10.  There is pain with compression of nails x10.  There are hyperkeratotic lesions noted on the dorsolateral 5th toe PIPJs, the right 4th interspace, and left submet 5 .     Latest Ref Rng & Units 01/18/2024   10:23 AM  Hemoglobin A1C  Hemoglobin-A1c 4.8 - 5.6 % 5.9    Patient qualifies for at-risk foot care because of diabetes with PVD .  Assessment/Plan: 1. Pain due to onychomycosis of toenails of both feet   2. Corns   3. Type II diabetes mellitus with peripheral circulatory disorder (HCC)    Mycotic nails x10 were sharply debrided with sterile nail nippers and power debriding burr to decrease bulk and length.  Hyperkeratotic lesions x4 were shaved with #312 blade.  All were proximal to the DIPJ of the toes.  Return in  about 9 weeks (around 03/25/2024) for [facility].   Joe Murders, DPM, FACFAS [facility]     [address] [place], [place] [address]                Office [phone]  Fax [phone]

## 2024-01-24 DIAGNOSIS — J449 Chronic obstructive pulmonary disease, unspecified: Secondary | ICD-10-CM | POA: Diagnosis not present

## 2024-01-26 ENCOUNTER — Other Ambulatory Visit (HOSPITAL_BASED_OUTPATIENT_CLINIC_OR_DEPARTMENT_OTHER): Admitting: Radiology

## 2024-01-27 ENCOUNTER — Other Ambulatory Visit (HOSPITAL_BASED_OUTPATIENT_CLINIC_OR_DEPARTMENT_OTHER): Payer: Self-pay | Admitting: Student

## 2024-01-27 DIAGNOSIS — Z1231 Encounter for screening mammogram for malignant neoplasm of breast: Secondary | ICD-10-CM

## 2024-01-29 ENCOUNTER — Inpatient Hospital Stay (HOSPITAL_BASED_OUTPATIENT_CLINIC_OR_DEPARTMENT_OTHER): Admission: RE | Admit: 2024-01-29 | Source: Ambulatory Visit | Admitting: Radiology

## 2024-02-01 ENCOUNTER — Ambulatory Visit (INDEPENDENT_AMBULATORY_CARE_PROVIDER_SITE_OTHER)
Admission: RE | Admit: 2024-02-01 | Discharge: 2024-02-01 | Disposition: A | Discharge status: Home / Self Care | Source: Ambulatory Visit | Attending: Student | Admitting: Student

## 2024-02-01 DIAGNOSIS — Z1231 Encounter for screening mammogram for malignant neoplasm of breast: Secondary | ICD-10-CM | POA: Diagnosis not present

## 2024-02-03 ENCOUNTER — Ambulatory Visit (HOSPITAL_BASED_OUTPATIENT_CLINIC_OR_DEPARTMENT_OTHER): Payer: Self-pay | Admitting: Student

## 2024-02-04 ENCOUNTER — Other Ambulatory Visit (HOSPITAL_BASED_OUTPATIENT_CLINIC_OR_DEPARTMENT_OTHER): Payer: Self-pay | Admitting: Student

## 2024-02-04 DIAGNOSIS — F410 Panic disorder [episodic paroxysmal anxiety] without agoraphobia: Secondary | ICD-10-CM

## 2024-02-04 DIAGNOSIS — J441 Chronic obstructive pulmonary disease with (acute) exacerbation: Secondary | ICD-10-CM

## 2024-02-05 ENCOUNTER — Ambulatory Visit (HOSPITAL_BASED_OUTPATIENT_CLINIC_OR_DEPARTMENT_OTHER)
Admission: RE | Admit: 2024-02-05 | Discharge: 2024-02-05 | Disposition: A | Discharge status: Home / Self Care | Source: Ambulatory Visit | Attending: Student | Admitting: Radiology

## 2024-02-05 DIAGNOSIS — Z780 Asymptomatic menopausal state: Secondary | ICD-10-CM | POA: Diagnosis not present

## 2024-02-05 DIAGNOSIS — M8580 Other specified disorders of bone density and structure, unspecified site: Secondary | ICD-10-CM

## 2024-02-05 DIAGNOSIS — E2839 Other primary ovarian failure: Secondary | ICD-10-CM | POA: Diagnosis not present

## 2024-02-05 DIAGNOSIS — M8589 Other specified disorders of bone density and structure, multiple sites: Secondary | ICD-10-CM | POA: Diagnosis not present

## 2024-02-15 ENCOUNTER — Other Ambulatory Visit (HOSPITAL_BASED_OUTPATIENT_CLINIC_OR_DEPARTMENT_OTHER): Payer: Self-pay

## 2024-02-15 MED ORDER — IPRATROPIUM-ALBUTEROL 0.5-2.5 (3) MG/3ML IN SOLN: 3.0000 mL | RESPIRATORY_TRACT | 3 refills | Status: DC | PRN

## 2024-02-15 MED ORDER — BUSPIRONE HCL 15 MG PO TABS
7.5000 mg | ORAL_TABLET | Freq: Two times a day (BID) | ORAL | 5 refills | Status: DC
  Filled 2024-02-15: qty 30, 30d supply, fill #0

## 2024-02-15 MED ORDER — ALBUTEROL SULFATE (2.5 MG/3ML) 0.083% IN NEBU
3.0000 mL | INHALATION_SOLUTION | Freq: Four times a day (QID) | RESPIRATORY_TRACT | 5 refills | Status: DC | PRN
  Filled 2024-02-15: qty 75, 7d supply, fill #0

## 2024-02-15 MED ORDER — ATORVASTATIN CALCIUM 40 MG PO TABS
40.0000 mg | ORAL_TABLET | Freq: Every day | ORAL | 3 refills | Status: DC
  Filled 2024-03-21: qty 90, 90d supply, fill #0

## 2024-02-15 MED ORDER — STIOLTO RESPIMAT 2.5-2.5 MCG/ACT IN AERS
2.0000 | INHALATION_SPRAY | Freq: Every day | RESPIRATORY_TRACT | 5 refills | Status: DC
  Filled 2024-02-22: qty 4, 30d supply, fill #0
  Filled 2024-03-14 – 2024-03-17 (×2): qty 4, 30d supply, fill #1
  Filled 2024-04-12: qty 4, 30d supply, fill #2
  Filled 2024-05-14: qty 4, 30d supply, fill #3
  Filled ????-??-??: fill #1

## 2024-02-15 MED ORDER — CETIRIZINE HCL 10 MG PO TABS
10.0000 mg | ORAL_TABLET | Freq: Every day | ORAL | 11 refills | Status: DC
  Filled 2024-02-15: qty 30, 30d supply, fill #0

## 2024-02-15 MED FILL — Albuterol Sulfate Inhal Aero 108 MCG/ACT (90MCG Base Equiv): RESPIRATORY_TRACT | 25 days supply | Qty: 6.7 | Fill #0 | Status: CN

## 2024-02-16 ENCOUNTER — Other Ambulatory Visit (HOSPITAL_BASED_OUTPATIENT_CLINIC_OR_DEPARTMENT_OTHER): Payer: Self-pay

## 2024-02-22 ENCOUNTER — Other Ambulatory Visit (HOSPITAL_BASED_OUTPATIENT_CLINIC_OR_DEPARTMENT_OTHER): Payer: Self-pay

## 2024-02-23 ENCOUNTER — Telehealth (HOSPITAL_BASED_OUTPATIENT_CLINIC_OR_DEPARTMENT_OTHER): Payer: Self-pay | Admitting: Student

## 2024-02-23 ENCOUNTER — Other Ambulatory Visit (HOSPITAL_BASED_OUTPATIENT_CLINIC_OR_DEPARTMENT_OTHER): Payer: Self-pay

## 2024-02-23 DIAGNOSIS — J449 Chronic obstructive pulmonary disease, unspecified: Secondary | ICD-10-CM | POA: Diagnosis not present

## 2024-02-23 MED ORDER — IPRATROPIUM-ALBUTEROL 0.5-2.5 (3) MG/3ML IN SOLN
3.0000 mL | Freq: Four times a day (QID) | RESPIRATORY_TRACT | 3 refills | Status: DC
  Filled 2024-02-23: qty 90, 8d supply, fill #0
  Filled 2024-03-28: qty 90, 8d supply, fill #1
  Filled 2024-04-06: qty 90, 8d supply, fill #2
  Filled 2024-06-07: qty 90, 8d supply, fill #3

## 2024-02-23 NOTE — Telephone Encounter (Signed)
 Copied from CRM [id number]. Topic: Clinical - Prescription Issue >> Feb 23, 2024 12:52 PM Wess RAMAN wrote: Reason for CRM: Melvern from [facility] stated the combination of diazepam  (VALIUM ) 5 MG tablet, meclizine (ANTIVERT) 25 MG tablet, and cyclobenzaprine (FLEXERIL) 5 MG tablet puts patients over the age of 36 at risk for sedation, sleep and breating problems. They want to suggest switching to a different medication or lower dosage.  Phone #: [phone] Fax #: [phone] [employer]

## 2024-02-26 ENCOUNTER — Other Ambulatory Visit (HOSPITAL_BASED_OUTPATIENT_CLINIC_OR_DEPARTMENT_OTHER): Payer: Self-pay

## 2024-03-01 NOTE — Telephone Encounter (Signed)
 Called [employer] and advised PCP is aware and will keep in mind moving forward.

## 2024-03-14 ENCOUNTER — Other Ambulatory Visit (HOSPITAL_BASED_OUTPATIENT_CLINIC_OR_DEPARTMENT_OTHER): Payer: Self-pay

## 2024-03-14 ENCOUNTER — Encounter (HOSPITAL_BASED_OUTPATIENT_CLINIC_OR_DEPARTMENT_OTHER): Payer: Self-pay | Admitting: Pharmacy Technician

## 2024-03-14 MED FILL — Albuterol Sulfate Inhal Aero 108 MCG/ACT (90MCG Base Equiv): RESPIRATORY_TRACT | 25 days supply | Qty: 6.7 | Fill #0 | Status: AC

## 2024-03-17 ENCOUNTER — Other Ambulatory Visit (HOSPITAL_BASED_OUTPATIENT_CLINIC_OR_DEPARTMENT_OTHER): Payer: Self-pay

## 2024-03-17 ENCOUNTER — Ambulatory Visit (HOSPITAL_BASED_OUTPATIENT_CLINIC_OR_DEPARTMENT_OTHER)

## 2024-03-17 ENCOUNTER — Encounter (HOSPITAL_BASED_OUTPATIENT_CLINIC_OR_DEPARTMENT_OTHER): Payer: Self-pay

## 2024-03-17 ENCOUNTER — Ambulatory Visit: Payer: Self-pay

## 2024-03-17 VITALS — BP 130/88 | Ht 63.5 in | Wt 182.0 lb

## 2024-03-17 DIAGNOSIS — Z0000 Encounter for general adult medical examination without abnormal findings: Secondary | ICD-10-CM | POA: Diagnosis not present

## 2024-03-17 NOTE — Progress Notes (Signed)
 Subjective:   Kaitlyn Rosales is a 68 y.o. female who presents for Medicare Annual (Subsequent) preventive examination.  Visit Complete: Virtual I connected with  Kaitlyn Rosales on 03/17/24 by a audio enabled telemedicine application and verified that I am speaking with the correct person using two identifiers.  Patient Location: Home  Provider Location: Office/Clinic  I discussed the limitations of evaluation and management by telemedicine. The patient expressed understanding and agreed to proceed.  Vital Signs: Because this visit was a virtual/telehealth visit, some criteria may be missing or patient reported. Any vitals not documented were not able to be obtained and vitals that have been documented are patient reported.  Patient Medicare AWV questionnaire was completed by the patient on 03/17/2024; I have confirmed that all information answered by patient is correct and no changes since this date.  Cardiac Risk Factors include: advanced age (>45men, >90 women);sedentary lifestyle;hypertension;smoking/ tobacco exposure     Objective:    Today's Vitals   03/17/24 1058  BP: 130/88  Weight: 182 lb (82.6 kg)  Height: 5' 3.5 (1.613 m)  PainSc: 5   PainLoc: Generalized   Body mass index is 31.73 kg/m.     03/17/2024   11:13 AM 11/01/2023   10:36 AM  Advanced Directives  Does Patient Have a Medical Advance Directive? No No  Would patient like information on creating a medical advance directive? Yes (Inpatient - patient defers creating a medical advance directive at this time - Information given)     Current Medications (verified) Outpatient Encounter Medications as of 03/17/2024  Medication Sig   acetaminophen (TYLENOL) 325 MG tablet Take 325 mg by mouth as needed (prn).   albuterol  (PROVENTIL ) (2.5 MG/3ML) 0.083% nebulizer solution Take 3 mLs by nebulization every 6 (six) hours as needed for wheezing   albuterol  (VENTOLIN  HFA) 108 (90 Base) MCG/ACT inhaler Inhale 2 puffs  into the lungs every 6 (six) hours as needed.   atorvastatin  (LIPITOR) 40 MG tablet Take 1 tablet by mouth daily.   atorvastatin  (LIPITOR) 40 MG tablet Take 1 tablet (40 mg total) by mouth daily for cholesterol   Blood Glucose Monitoring Suppl (ACCU-CHEK GUIDE ME) w/Device KIT by Does not apply route.   busPIRone  (BUSPAR ) 15 MG tablet Take 0.5 tablets (7.5 mg total) by mouth 2 (two) times daily.   busPIRone  (BUSPAR ) 7.5 MG tablet Take 1 tablet (7.5 mg total) by mouth 2 (two) times daily. (Patient taking differently: Take 7.5 mg by mouth as needed (as needed).)   cetirizine  (ZYRTEC ) 10 MG tablet Take 1 tablet (10 mg total) by mouth daily.   Cholecalciferol (VITAMIN D3) 250 MCG (10000 UT) TABS Take 10,000 Units by mouth daily.   cyclobenzaprine (FLEXERIL) 5 MG tablet Take 1 tablet by mouth 3 (three) times daily as needed.   diazepam  (VALIUM ) 5 MG tablet Take 0.5 tablet (2.5 mg total) by mouth every 6 (six) hours as needed for anxiety.   glucose blood (ACCU-CHEK GUIDE TEST) test strip Use to check glucose 3 times daily as instructed   guaiFENesin (MUCINEX) 600 MG 12 hr tablet Take 1,200 mg by mouth as needed.   ipratropium-albuterol  (DUONEB) 0.5-2.5 (3) MG/3ML SOLN Inhale 3 mLs into the lungs every 12 (twelve) hours.   ipratropium-albuterol  (DUONEB) 0.5-2.5 (3) MG/3ML SOLN Inhale 3 mLs by nebulization every 4 (four) hours as needed for wheezing   ipratropium-albuterol  (DUONEB) 0.5-2.5 (3) MG/3ML SOLN Take 3 mLs by nebulization every 6 (six) hours.   meclizine (ANTIVERT) 25 MG tablet Take 25 mg  by mouth every 6 (six) hours.   mupirocin  ointment (BACTROBAN ) 2 % Apply 1 Application topically 2 (two) times daily.   omeprazole (PRILOSEC) 20 MG capsule Take 20 mg by mouth daily.   ondansetron  (ZOFRAN -ODT) 4 MG disintegrating tablet Take 4 mg by mouth every 8 (eight) hours as needed.   OXYGEN  2 L by Does not apply route at bedtime.   STIOLTO RESPIMAT  2.5-2.5 MCG/ACT AERS Take 2 Inhalations by mouth once.    Tiotropium Bromide -Olodaterol (STIOLTO RESPIMAT ) 2.5-2.5 MCG/ACT AERS Inhale 2 puffs into the lungs daily.   No facility-administered encounter medications on file as of 03/17/2024.    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 Added automatically from request for surgery [phone]   Chronic respiratory failure with hypoxia (HCC) 01/09/2021   Gastroesophageal reflux disease without esophagitis 05/05/2017   History of MI (myocardial infarction) 03/07/2021   Formatting of this note might be different from the original. living in [place], never cathed (early 40s ~) patient reports MD said light heart attack   Mixed hyperlipidemia 10/29/2017   OSA (obstructive sleep apnea) 01/09/2021   Posttraumatic stress disorder 10/15/2016   Past Surgical History:  Procedure Laterality Date   APPENDECTOMY  1970   CERVICAL SPINE SURGERY  2020   CHOLECYSTECTOMY  1970   ELBOW SURGERY Right 1990   PARTIAL HYSTERECTOMY  1990   Family History  Problem Relation Age of Onset   Dementia Mother    Parkinson's disease Mother    Cirrhosis Father    Osteoarthritis Sister    Breast cancer Sister    COPD Sister    COPD Brother    Social History   Socioeconomic History   Marital status: Legally Separated    Spouse name: Not on file   Number of children: 2   Years of education: Not on file   Highest education level: High school graduate  Occupational History   Occupation: Disabled  Tobacco Use   Smoking status:  Every Day    Current packs/day: 0.50    Average packs/day: 0.5 packs/day for 57.6 years (28.8 ttl pk-yrs)    Types: Cigarettes    Start date: 1968    Passive exposure: Current   Smokeless tobacco: Never  Vaping Use   Vaping status: Never Used  Substance and Sexual Activity   Alcohol use: Not Currently   Drug use: Never   Sexual activity: Yes    Partners: Male  Other Topics Concern   Not on file  Social History Narrative   2 children living, 1 child passed away.   Social Drivers of Corporate investment banker Strain: Low Risk  (03/17/2024)   Overall Financial Resource Strain (CARDIA)    Difficulty of Paying Living Expenses: Not hard at all  Food Insecurity: No Food Insecurity (03/17/2024)   Hunger Vital Sign    Worried About Running Out of Food in the Last Year: Never true    Ran Out of Food in the Last Year: Never true  Transportation Needs: No Transportation Needs (03/17/2024)   PRAPARE - Administrator, Civil Service (Medical): No    Lack of Transportation (  Non-Medical): No  Physical Activity: Inactive (03/17/2024)   Exercise Vital Sign    Days of Exercise per Week: 0 days    Minutes of Exercise per Session: 0 min  Stress: No Stress Concern Present (03/17/2024)   [employer] of Occupational Health - Occupational Stress Questionnaire    Feeling of Stress: Not at all  Social Connections: Moderately Isolated (03/17/2024)   Social Connection and Isolation Panel    Frequency of Communication with Friends and Family: Three times a week    Frequency of Social Gatherings with Friends and Family: Three times a week    Attends Religious Services: More than 4 times per year    Active Member of Clubs or Organizations: No    Attends Banker Meetings: Never    Marital Status: Separated    Tobacco Counseling Ready to quit: Not Answered Counseling given: Not Answered   Clinical Intake:  Pre-visit preparation completed: No  Pain : 0-10 Pain Score: 5   Pain Type: Acute pain Pain Location: Generalized Pain Orientation: Other (Comment) (body aches, feels sick) Pain Radiating Towards: body aches, feels sick Pain Descriptors / Indicators: Constant Pain Onset: Today Pain Relieving Factors: tylenol  Pain Relieving Factors: tylenol  BMI - recorded: 31.73 Nutritional Status: BMI 25 -29 Overweight Nutritional Risks: Unintentional weight gain Diabetes: No  How often do you need to have someone help you when you read instructions, pamphlets, or other written materials from your doctor or pharmacy?: 1 - Never What is the last grade level you completed in school?: High School Diploma  Interpreter Needed?: No  Information entered by :: TJ, CMA   Activities of Daily Living    03/17/2024   11:06 AM  In your present state of health, do you have any difficulty performing the following activities:  Hearing? 1  Comment Trouble hearing out of left ear. Has not lost complete hearing. Doctor said to not use hearing aids yet.  Vision? 1  Comment Wears glases.  Difficulty concentrating or making decisions? 0  Walking or climbing stairs? 0  Dressing or bathing? 0  Doing errands, shopping? 0  Preparing Food and eating ? N  Using the Toilet? N  In the past six months, have you accidently leaked urine? N  Do you have problems with loss of bowel control? N  Managing your Medications? N  Managing your Finances? N  Housekeeping or managing your Housekeeping? N    Patient Care Team: Rothfuss, Jacob T, [name] as PCP - General (Physician Assistant)  Indicate any recent Medical Services you may have received from other than [facility] providers in the past year (date may be approximate).     Assessment:   This is a routine wellness examination for Kaitlyn Rosales.  Hearing/Vision screen Hearing Screening - Comments:: Trouble hearing out of left ear. Has not lost complete hearing. Doctor said to not use hearing aids yet.  Vision Screening - Comments:: Wears  glases.   Goals Addressed               This Visit's Progress     Patient Stated (pt-stated)        Patient stated she would like to go to the beach for her birthday.        Depression Screen    03/17/2024   11:11 AM 12/07/2023    9:52 AM 09/12/2021   10:52 AM 03/07/2021   10:48 AM  PHQ 2/9 Scores  PHQ - 2 Score 0 0 0 0  PHQ- 9  Score  0      Fall Risk    03/17/2024   11:13 AM 01/18/2024    9:34 AM 12/07/2023    9:52 AM 09/12/2021   10:52 AM 03/07/2021   10:57 AM  Fall Risk   Falls in the past year? 1 0 1 0 0  Number falls in past yr: 0 0 1 0 0  Injury with Fall? 0 0 1 0 0  Risk for fall due to : No Fall Risks No Fall Risks History of fall(s);Impaired balance/gait;Impaired mobility  Impaired balance/gait  Follow up Falls evaluation completed;Education provided;Falls prevention discussed Falls evaluation completed;Education provided;Falls prevention discussed Falls evaluation completed;Education provided;Falls prevention discussed  Falls prevention discussed      Data saved with a previous flowsheet row definition     MEDICARE RISK AT HOME: Medicare Risk at Home Any stairs in or around the home?: Yes If so, are there any without handrails?: Yes Home free of loose throw rugs in walkways, pet beds, electrical cords, etc?: No Adequate lighting in your home to reduce risk of falls?: Yes Life alert?: No Use of a cane, walker or w/c?: No Grab bars in the bathroom?: No Shower chair or bench in shower?: Yes Elevated toilet seat or a handicapped toilet?: Yes  TIMED UP AND GO:  Was the test performed?  No    Cognitive Function:        03/17/2024   11:16 AM  6CIT Screen  What Year? 0 points  What month? 0 points  What time? 0 points  Count back from 20 0 points  Months in reverse 4 points  Repeat phrase 4 points  Total Score 8 points    Immunizations Immunization History  Administered Date(s) Administered   Fluad Quad(high Dose 65+) 05/15/2021   Influenza Split  06/09/2019   Influenza, High Dose Seasonal PF 05/01/2020, 08/10/2023   Influenza, Quadrivalent, Recombinant, Inj, Pf 05/27/2017   Influenza,inj,Quad PF,6+ Mos 05/11/2018   Influenza,inj,Quad PF,6-35 Mos 05/11/2018   Influenza-Unspecified 05/01/2020, 04/25/2022   Janssen (J&J) SARS-COV-2 Vaccination 11/21/2019   Tdap 10/02/2021    TDAP status: Up to date  Flu Vaccine status: Due, Education has been provided regarding the importance of this vaccine. Advised may receive this vaccine at local pharmacy or Health Dept. Aware to provide a copy of the vaccination record if obtained from local pharmacy or Health Dept. Verbalized acceptance and understanding.  Pneumococcal vaccine status: Due, Education has been provided regarding the importance of this vaccine. Advised may receive this vaccine at local pharmacy or Health Dept. Aware to provide a copy of the vaccination record if obtained from local pharmacy or Health Dept. Verbalized acceptance and understanding.  Covid-19 vaccine status: Declined, Education has been provided regarding the importance of this vaccine but patient still declined. Advised may receive this vaccine at local pharmacy or Health Dept.or vaccine clinic. Aware to provide a copy of the vaccination record if obtained from local pharmacy or Health Dept. Verbalized acceptance and understanding.  Qualifies for Shingles Vaccine? Yes   Zostavax completed No   Shingrix Completed?: No.    Education has been provided regarding the importance of this vaccine. Patient has been advised to call insurance company to determine out of pocket expense if they have not yet received this vaccine. Advised may also receive vaccine at local pharmacy or Health Dept. Verbalized acceptance and understanding.  Screening Tests Health Maintenance  Topic Date Due   Lung Cancer Screening  Never done   FOOT EXAM  03/07/2022   INFLUENZA VACCINE  03/11/2024   OPHTHALMOLOGY EXAM  05/25/2025 (Originally  04/13/1966)   HEMOGLOBIN A1C  07/19/2024   Diabetic kidney evaluation - eGFR measurement  12/06/2024   Diabetic kidney evaluation - Urine ACR  01/17/2025   Medicare Annual Wellness (AWV)  03/17/2025   MAMMOGRAM  01/31/2026   Colonoscopy  12/26/2028   DTaP/Tdap/Td (2 - Td or Tdap) 10/03/2031   DEXA SCAN  Completed   Hepatitis B Vaccines  Aged Out   HPV VACCINES  Aged Out   Meningococcal B Vaccine  Aged Out   Pneumococcal Vaccine: 50+ Years  Discontinued   COVID-19 Vaccine  Discontinued   Hepatitis C Screening  Discontinued   Zoster Vaccines- Shingrix  Discontinued    Health Maintenance  Health Maintenance Due  Topic Date Due   Lung Cancer Screening  Never done   FOOT EXAM  03/07/2022   INFLUENZA VACCINE  03/11/2024    Colorectal cancer screening: Type of screening: Colonoscopy. Completed 12/26/2021. Repeat every 7 years  Mammogram status: Completed 02/01/2024. Repeat every year  Bone Density status: Completed 02/05/2024. Results reflect: Bone density results: OSTEOPENIA. Repeat every 2 years.  Lung Cancer Screening: (Low Dose CT Chest recommended if Age 45-80 years, 20 pack-year currently smoking OR have quit w/in 15years.) does not qualify.   Lung Cancer Screening Referral: N/A  Additional Screening:  Hepatitis C Screening: does not qualify; Completed N/A  Vision Screening: Recommended annual ophthalmology exams for early detection of glaucoma and other disorders of the eye. Is the patient up to date with their annual eye exam?  Requesting records, said it was last year Who is the provider or what is the name of the office in which the patient attends annual eye exams? [facility], [place]  If pt is not established with a provider, would they like to be referred to a provider to establish care? No .   Dental Screening: Recommended annual dental exams for proper oral hygiene  Diabetic Foot Exam: Diabetic Foot Exam: Overdue, Pt has been advised about the importance in  completing this exam. Pt is scheduled for diabetic foot exam on will discuss in future vitis.  Community Resource Referral / Chronic Care Management: CRR required this visit?  No   CCM required this visit?  No     Plan:     I have personally reviewed and noted the following in the patient's chart:   Medical and social history Use of alcohol, tobacco or illicit drugs  Current medications and supplements including opioid prescriptions. Patient is not currently taking opioid prescriptions. Functional ability and status Nutritional status Physical activity Advanced directives List of other physicians Hospitalizations, surgeries, and ER visits in previous 12 months Vitals Screenings to include cognitive, depression, and falls Referrals and appointments  In addition, I have reviewed and discussed with patient certain preventive protocols, quality metrics, and best practice recommendations. A written personalized care plan for preventive services as well as general preventive health recommendations were provided to patient.     Carlie Agent, CMA   03/17/2024   After Visit Summary: (Pick Up) Due to this being a telephonic visit, with patients personalized plan was offered to patient and patient has requested to Pick up at office.  Nurse Notes: Flu vaccine is due.

## 2024-03-17 NOTE — Patient Instructions (Signed)
  Kaitlyn Rosales , Thank you for taking time to come for your Medicare Wellness Visit. I appreciate your ongoing commitment to your health goals. Please review the following plan we discussed and let me know if I can assist you in the future.   These are the goals we discussed:  Goals       Patient Stated (pt-stated)      Patient stated she would like to go to the beach for her birthday.         This is a list of the screening recommended for you and due dates:  Health Maintenance  Topic Date Due   Screening for Lung Cancer  Never done   Complete foot exam   03/07/2022   Flu Shot  03/11/2024   Eye exam for diabetics  05/25/2025*   Hemoglobin A1C  07/19/2024   Yearly kidney function blood test for diabetes  12/06/2024   Yearly kidney health urinalysis for diabetes  01/17/2025   Medicare Annual Wellness Visit  03/17/2025   Mammogram  01/31/2026   Colon Cancer Screening  12/26/2028   DTaP/Tdap/Td vaccine (2 - Td or Tdap) 10/03/2031   DEXA scan (bone density measurement)  Completed   Hepatitis B Vaccine  Aged Out   HPV Vaccine  Aged Out   Meningitis B Vaccine  Aged Out   Pneumococcal Vaccine for age over 9  Discontinued   COVID-19 Vaccine  Discontinued   Hepatitis C Screening  Discontinued   Zoster (Shingles) Vaccine  Discontinued  *Topic was postponed. The date shown is not the original due date.

## 2024-03-17 NOTE — Telephone Encounter (Signed)
 FYI Only or Action Required?: FYI only for provider.  Patient was last seen in primary care on 10/28/2021 by Nicholaus Credit, [name].  Called Nurse Triage reporting Cough.  Symptoms began a week ago.  Interventions attempted: Nothing.  Symptoms are: gradually worsening.  Triage Disposition: See HCP Within 4 Hours (Or PCP Triage)  Patient/caregiver understands and will follow disposition?: Yes, will follow disposition  Copied from CRM #[id number]. Topic: Clinical - Red Word Triage >> Mar 17, 2024  9:34 AM Kaitlyn Rosales wrote: Red Word that prompted transfer to Nurse Triage: Received all from patient, reports shortness of breath, wheezing, and difficulty breathing. Patient states she has COPD. Patient states she uses nebulizer, but has no relief.  Has coughing, and coughing up brown mucous.  Patient also reports loose stool, diarrhea, and weakness. Reason for Disposition  [1] MILD difficulty breathing (e.g., minimal/no SOB at rest, SOB with walking, pulse < 100) AND [2] still present when not coughing  Answer Assessment - Initial Assessment Questions 1. ONSET: When did the cough begin?      week 2. SEVERITY: How bad is the cough today?      moderate 3. SPUTUM: Describe the color of your sputum (e.g., none, dry cough; clear, white, yellow, green)     brown 4. HEMOPTYSIS: Are you coughing up any blood? If Yes, ask: How much? (e.g., flecks, streaks, tablespoons, etc.)     denies 5. DIFFICULTY BREATHING: Are you having difficulty breathing? If Yes, ask: How bad is it? (e.g., mild, moderate, severe)      Mildly worse than normal 6. FEVER: Do you have a fever? If Yes, ask: What is your temperature, how was it measured, and when did it start?     denies 7. CARDIAC HISTORY: Do you have any history of heart disease? (e.g., heart attack, congestive heart failure)      afib 8. LUNG HISTORY: Do you have any history of lung disease?  (e.g., pulmonary embolus, asthma, emphysema)      COPD 9. PE RISK FACTORS: Do you have a history of blood clots? (or: recent major surgery, recent prolonged travel, bedridden)     denies 10. OTHER SYMPTOMS: Do you have any other symptoms? (e.g., runny nose, wheezing, chest pain)       Cough, wheezing  No appts available today in clinic, advised pt to utilize UC, pt agreeable.  Protocols used: Cough - Acute Productive-A-AH

## 2024-03-18 ENCOUNTER — Other Ambulatory Visit (HOSPITAL_BASED_OUTPATIENT_CLINIC_OR_DEPARTMENT_OTHER): Admitting: Radiology

## 2024-03-18 ENCOUNTER — Ambulatory Visit (INDEPENDENT_AMBULATORY_CARE_PROVIDER_SITE_OTHER): Admitting: Student

## 2024-03-18 ENCOUNTER — Encounter (HOSPITAL_BASED_OUTPATIENT_CLINIC_OR_DEPARTMENT_OTHER): Payer: Self-pay | Admitting: Student

## 2024-03-18 ENCOUNTER — Ambulatory Visit (HOSPITAL_BASED_OUTPATIENT_CLINIC_OR_DEPARTMENT_OTHER): Payer: Self-pay | Admitting: Student

## 2024-03-18 ENCOUNTER — Ambulatory Visit (HOSPITAL_BASED_OUTPATIENT_CLINIC_OR_DEPARTMENT_OTHER)
Admission: RE | Admit: 2024-03-18 | Discharge: 2024-03-18 | Disposition: A | Discharge status: Home / Self Care | Source: Ambulatory Visit | Attending: Student | Admitting: Student

## 2024-03-18 ENCOUNTER — Other Ambulatory Visit (HOSPITAL_BASED_OUTPATIENT_CLINIC_OR_DEPARTMENT_OTHER): Payer: Self-pay

## 2024-03-18 VITALS — BP 139/75 | HR 95 | Temp 98.6°F | Resp 16 | Ht 63.5 in | Wt 183.3 lb

## 2024-03-18 DIAGNOSIS — R059 Cough, unspecified: Secondary | ICD-10-CM | POA: Diagnosis not present

## 2024-03-18 DIAGNOSIS — Z889 Allergy status to unspecified drugs, medicaments and biological substances status: Secondary | ICD-10-CM | POA: Diagnosis not present

## 2024-03-18 DIAGNOSIS — J441 Chronic obstructive pulmonary disease with (acute) exacerbation: Secondary | ICD-10-CM

## 2024-03-18 DIAGNOSIS — J449 Chronic obstructive pulmonary disease, unspecified: Secondary | ICD-10-CM | POA: Diagnosis not present

## 2024-03-18 DIAGNOSIS — S41111A Laceration without foreign body of right upper arm, initial encounter: Secondary | ICD-10-CM | POA: Diagnosis not present

## 2024-03-18 MED ORDER — TRIAMCINOLONE ACETONIDE 40 MG/ML IJ SUSP
60.0000 mg | Freq: Once | INTRAMUSCULAR | Status: AC
  Administered 2024-03-18: 60 mg via INTRAMUSCULAR

## 2024-03-18 MED ORDER — CIPROFLOXACIN HCL 750 MG PO TABS
750.0000 mg | ORAL_TABLET | Freq: Two times a day (BID) | ORAL | 0 refills | Status: AC
  Filled 2024-03-18: qty 20, 10d supply, fill #0

## 2024-03-18 MED ORDER — EPINEPHRINE 0.3 MG/0.3ML IJ SOAJ
0.3000 mg | INTRAMUSCULAR | 0 refills | Status: AC | PRN
  Filled 2024-03-18: qty 2, 15d supply, fill #0

## 2024-03-18 MED ORDER — MUPIROCIN 2 % EX OINT
1.0000 | TOPICAL_OINTMENT | Freq: Two times a day (BID) | CUTANEOUS | 0 refills | Status: DC
  Filled 2024-03-18: qty 22, 11d supply, fill #0

## 2024-03-18 NOTE — Patient Instructions (Signed)
 It was nice to see you today!  Come back in a week if you are not feeling any better by then. Please go to the ER if your O2 is dropping below 90%.  If you have any problems before your next visit feel free to message me via MyChart (minor issues or questions) or call the office, otherwise you may reach out to schedule an office visit.  Thank you! Kaedance Magos, [name]

## 2024-03-18 NOTE — Progress Notes (Signed)
 Acute Office Visit  Subjective:     Patient ID: Kaitlyn Rosales, female    DOB: 01/18/1956, 68 y.o.   MRN: [id number]  Chief Complaint  Patient presents with   Shortness of Breath    Been sick since last Thursday. Has been doing nebs but it is not helping. Having headaches, no fevers (98.83F), coughing, wheezing, hurting all over. Tried tylenol, liquid IV, nothing is helping.    Discussed the use of AI scribe software for clinical note transcription with the patient, who gave verbal consent to proceed.  History of Present Illness   Kaitlyn Rosales is a 68 year old female with COPD who presents with a cough and shortness of breath.  She has been experiencing a cough with red-tinged sputum and increased wheezing. The sputum appears 'red' and 'a little different than usual.' She has been using her nebulizer three times a day to manage her symptoms.  She has a history of COPD and has experienced exacerbations in the past. She has tried Tylenol and liquid IV for symptom relief but has not used ibuprofen. She is allergic to many antibiotics, including penicillin and Augmentin , but has tolerated Cipro  in the past. She reports that steroid pills irritate an ulcer in her throat, but she has not had a reaction to steroid injections in the past.  She is currently under the care of a pulmonologist but has only seen her once in the past two years due to insurance limitations on testing frequency. She is scheduled for a deep CT scan in November and a follow-up appointment in December.  She uses mupirocin  ointment on skin abrasions to prevent infection and occasionally in her nose to alleviate congestion, which affects her hearing. She recently sustained a scratch on her right arm from her daughter's dog, which has turned blue.  She has a history of hip issues, with a previous doctor recommending replacement, but she has declined due to her age. She has felt unwell for the past two days, with increased  coughing and difficulty speaking by nighttime.      Review of Systems  Respiratory:  Positive for shortness of breath.    Per HPI     Objective:    BP 139/75   Pulse 95   Temp 98.6 F (37 C) (Oral)   Resp 16   Ht 5' 3.5 (1.613 m)   Wt 183 lb 4.8 oz (83.1 kg)   LMP  (LMP Unknown)   SpO2 95%   BMI 31.96 kg/m    Physical Exam Constitutional:      General: She is not in acute distress.    Appearance: Normal appearance. She is not ill-appearing.  HENT:     Head: Normocephalic and atraumatic.     Nose: Nose normal.  Eyes:     General: No scleral icterus.    Conjunctiva/sclera: Conjunctivae normal.  Cardiovascular:     Rate and Rhythm: Normal rate and regular rhythm.     Heart sounds: Normal heart sounds. No murmur heard.    No friction rub.  Pulmonary:     Effort: Pulmonary effort is normal. No respiratory distress.     Breath sounds: Wheezing present. No rhonchi or rales.  Musculoskeletal:        General: Normal range of motion.  Skin:    General: Skin is warm and dry.     Coloration: Skin is not cyanotic, jaundiced or pale.  Neurological:     General: No focal deficit present.  Mental Status: She is alert.  Psychiatric:        Mood and Affect: Mood normal.        Behavior: Behavior normal.     No results found for any visits on 03/18/24.      Assessment & Plan:    Assessment and Plan    Chronic obstructive pulmonary disease with acute exacerbation Chronic with acute exacerbation. COPD exacerbation likely due to increased coughing and production of red-tinged sputum. Wheezing present, no fluid sounds. Difficulty with steroid pills due to esophageal ulcer. Previous issues with steroid injections, no documented reactions to Kenalog . - Prescribe Cipro  750 mg for 7-10 days - Administer 60 mg Kenalog  injection - Order chest x-ray stat - Advise to monitor oxygen  levels at home and go to ER if oxygen  drops below 90% - Recommend over-the-counter Mucinex  (not DM) to help break up mucus - Provide EpiPen  for emergency use in case of severe allergic reaction - Advise to return if symptoms do not improve or worsen  Antibiotic allergy with risk of anaphylaxis Allergic to multiple antibiotics including penicillins. Previous tolerance to Cipro . No EpiPen  currently available for emergency use. - Prescribe Cipro  750 mg for 7-10 days - Provide EpiPen  for emergency use in case of severe allergic reaction - Instruct to use EpiPen  and go to ER if throat closes up or severe reaction occurs  Right arm laceration Laceration on right arm caused by a dog scratch. Using mupirocin  ointment to prevent infection. - order mupirocin  ointment to prevent infection      Return if symptoms worsen or fail to improve.  Kaitlyn Rosales T Kaitlyn Petraglia, [name]

## 2024-03-21 ENCOUNTER — Other Ambulatory Visit (HOSPITAL_BASED_OUTPATIENT_CLINIC_OR_DEPARTMENT_OTHER): Payer: Self-pay

## 2024-03-23 ENCOUNTER — Telehealth (HOSPITAL_BASED_OUTPATIENT_CLINIC_OR_DEPARTMENT_OTHER): Payer: Self-pay

## 2024-03-23 NOTE — Telephone Encounter (Signed)
 Pt said she said she had been up all night with acid reflux. Had to drink milk. Cannot take this dose. Per PCP, she needs this dose due to frequent COPD flare ups, had changes in color of mucus, and airways. He will not lower dose due to possibility of rebound. Pt stated she will contact Pulmon to discuss changing to lower dose.

## 2024-03-25 ENCOUNTER — Ambulatory Visit (INDEPENDENT_AMBULATORY_CARE_PROVIDER_SITE_OTHER): Admitting: Podiatry

## 2024-03-25 DIAGNOSIS — M79675 Pain in left toe(s): Secondary | ICD-10-CM

## 2024-03-25 DIAGNOSIS — B351 Tinea unguium: Secondary | ICD-10-CM

## 2024-03-25 DIAGNOSIS — E1151 Type 2 diabetes mellitus with diabetic peripheral angiopathy without gangrene: Secondary | ICD-10-CM

## 2024-03-25 DIAGNOSIS — D2371 Other benign neoplasm of skin of right lower limb, including hip: Secondary | ICD-10-CM | POA: Diagnosis not present

## 2024-03-25 DIAGNOSIS — M79674 Pain in right toe(s): Secondary | ICD-10-CM | POA: Diagnosis not present

## 2024-03-25 DIAGNOSIS — D492 Neoplasm of unspecified behavior of bone, soft tissue, and skin: Secondary | ICD-10-CM

## 2024-03-25 DIAGNOSIS — J449 Chronic obstructive pulmonary disease, unspecified: Secondary | ICD-10-CM | POA: Diagnosis not present

## 2024-03-25 NOTE — Progress Notes (Addendum)
 Subjective:  Patient ID: Kaitlyn Rosales, female    DOB: November 19, 1955,  MRN: [id number]  Kaitlyn Rosales presents to clinic today for:  Chief Complaint  Patient presents with   [facility]    [facility] with callous. Painful callous, making it hard to walk. Last A1c 5.7 in June, no anti coag.    Patient notes nails are thick and elongated, causing pain in shoe gear when ambulating.  She has a painful skin lesion right submet 5.   PCP is Rothfuss, Jacob T, [name].  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 Added automatically from request for surgery [id number]   Chronic respiratory failure with hypoxia (HCC) 01/09/2021   Gastroesophageal reflux disease without esophagitis 05/05/2017   History of MI (myocardial infarction) 03/07/2021   Formatting of this note might be different from the original. living in [place], never cathed (early 40s ~) patient reports MD said light heart attack   Mixed hyperlipidemia 10/29/2017   OSA (obstructive sleep apnea) 01/09/2021   Posttraumatic stress disorder 10/15/2016   Allergies  Allergen Reactions   Amoxicillin -Pot Clavulanate Hives    Took benadryl  and symptoms resolved in 2 days. No angioedema.   Azithromycin  Shortness Of Breath    Throat closing    Doxycycline  Anaphylaxis   Levofloxacin Itching and Rash   Ceftriaxone  Other (See Comments)    coughing, difficulty swallowing, rash - unclear if caused by ceftriaxone  - has tolerated cefazolin and cephalexin  previously   Acetaminophen    Clavulanic Acid    Nitrofurantoin Other (See Comments), Swelling and Hives    Required hospitalization   Oxycodone-Acetaminophen Other (See Comments)    Auditory hallucinations, unable to sleep   Prednisone  Swelling     CANNOT DO PILLS. Swelling in mouth.   Tape Dermatitis    Paper tape is fine   Trimethoprim    Sulfamethoxazole-Trimethoprim Rash    Objective:  Kaitlyn Rosales is a pleasant 68 y.o. female in NAD. AAO x 3.  Vascular Examination: Patient has palpable DP pulse, absent PT pulse bilateral.  Delayed capillary refill bilateral toes.  Sparse digital hair bilateral.  Proximal to distal cooling WNL bilateral.    Dermatological Examination: Interspaces are clear with no open lesions noted bilateral.  Skin is shiny and atrophic bilateral.  Nails are 3-73mm thick, with yellowish/brown discoloration, subungual debris and distal onycholysis x10.  There is pain with compression of nails x10.  There is a skin dermatofibroma right submet 5 with pain on palpation.       Latest Ref Rng & Units 01/18/2024   10:23 AM  Hemoglobin A1C  Hemoglobin-A1c 4.8 - 5.6 % 5.9    Patient qualifies for at-risk foot care because of diabetes with PVD .  Assessment/Plan: 1. Pain due to onychomycosis of toenails of both feet   2. Type II diabetes mellitus with peripheral circulatory disorder (HCC)   3. Skin neoplasm   4. Dermatofibroma of right lower leg    Mycotic nails x10 were sharply debrided with sterile nail nippers and power debriding burr to decrease bulk and length.  The dermatofibroma left submet 5 was shaved with a sterile #313 blade and treated with 2 applications of Cantharone, followed by occlusive bandaid.  Blistering may occur tomorrow.  Return in about 9 weeks (around 05/27/2024) for [facility].   Awanda CHARM Imperial, DPM, FACFAS [facility]     [address] [place], [place] [address]                Office [phone]  Fax [phone]

## 2024-03-28 ENCOUNTER — Other Ambulatory Visit (HOSPITAL_BASED_OUTPATIENT_CLINIC_OR_DEPARTMENT_OTHER): Payer: Self-pay

## 2024-03-28 ENCOUNTER — Other Ambulatory Visit (HOSPITAL_COMMUNITY): Payer: Self-pay

## 2024-03-28 ENCOUNTER — Other Ambulatory Visit (HOSPITAL_BASED_OUTPATIENT_CLINIC_OR_DEPARTMENT_OTHER): Payer: Self-pay | Admitting: Student

## 2024-03-28 DIAGNOSIS — F410 Panic disorder [episodic paroxysmal anxiety] without agoraphobia: Secondary | ICD-10-CM

## 2024-03-28 MED ORDER — DIAZEPAM 5 MG PO TABS
2.5000 mg | ORAL_TABLET | Freq: Four times a day (QID) | ORAL | 0 refills | Status: AC | PRN
  Filled 2024-03-28: qty 30, 15d supply, fill #0

## 2024-03-28 NOTE — Telephone Encounter (Signed)
 Rx called in by PCP already

## 2024-03-28 NOTE — Telephone Encounter (Signed)
 Copied from CRM #[id number]. Topic: Clinical - Medication Refill >> Mar 28, 2024 11:55 AM Willma R wrote: Medication: diazepam  (VALIUM ) 5 MG tablet  Has the patient contacted their pharmacy? Yes, call Dr  This is the patient's preferred pharmacy:  [facility] [address] [place] [address] Phone: [phone] Fax: [phone]  Is this the correct pharmacy for this prescription? Yes If no, delete pharmacy and type the correct one.   Has the prescription been filled recently? No  Is the patient out of the medication? Yes  Has the patient been seen for an appointment in the last year OR does the patient have an upcoming appointment? Yes  Can we respond through MyChart? Yes  Agent: Please be advised that Rx refills may take up to 3 business days. We ask that you follow-up with your pharmacy.

## 2024-04-06 ENCOUNTER — Other Ambulatory Visit (HOSPITAL_BASED_OUTPATIENT_CLINIC_OR_DEPARTMENT_OTHER): Payer: Self-pay

## 2024-04-12 ENCOUNTER — Other Ambulatory Visit: Payer: Self-pay

## 2024-04-12 ENCOUNTER — Other Ambulatory Visit (HOSPITAL_BASED_OUTPATIENT_CLINIC_OR_DEPARTMENT_OTHER): Payer: Self-pay

## 2024-04-12 ENCOUNTER — Other Ambulatory Visit (HOSPITAL_BASED_OUTPATIENT_CLINIC_OR_DEPARTMENT_OTHER): Payer: Self-pay | Admitting: Student

## 2024-04-12 DIAGNOSIS — J441 Chronic obstructive pulmonary disease with (acute) exacerbation: Secondary | ICD-10-CM

## 2024-04-12 MED ORDER — ALBUTEROL SULFATE HFA 108 (90 BASE) MCG/ACT IN AERS
2.0000 | INHALATION_SPRAY | Freq: Four times a day (QID) | RESPIRATORY_TRACT | 1 refills | Status: DC | PRN
  Filled 2024-04-12: qty 6.7, 25d supply, fill #0
  Filled 2024-05-15: qty 6.7, 25d supply, fill #1

## 2024-04-13 ENCOUNTER — Other Ambulatory Visit (HOSPITAL_BASED_OUTPATIENT_CLINIC_OR_DEPARTMENT_OTHER): Payer: Self-pay

## 2024-04-14 ENCOUNTER — Other Ambulatory Visit (HOSPITAL_COMMUNITY): Payer: Self-pay

## 2024-04-15 ENCOUNTER — Other Ambulatory Visit (HOSPITAL_BASED_OUTPATIENT_CLINIC_OR_DEPARTMENT_OTHER): Payer: Self-pay

## 2024-04-19 ENCOUNTER — Ambulatory Visit (HOSPITAL_BASED_OUTPATIENT_CLINIC_OR_DEPARTMENT_OTHER): Admitting: Student

## 2024-04-19 ENCOUNTER — Ambulatory Visit (HOSPITAL_BASED_OUTPATIENT_CLINIC_OR_DEPARTMENT_OTHER): Payer: Self-pay | Admitting: Student

## 2024-04-19 ENCOUNTER — Other Ambulatory Visit (HOSPITAL_BASED_OUTPATIENT_CLINIC_OR_DEPARTMENT_OTHER): Payer: Self-pay

## 2024-04-19 ENCOUNTER — Encounter (HOSPITAL_BASED_OUTPATIENT_CLINIC_OR_DEPARTMENT_OTHER): Payer: Self-pay | Admitting: Student

## 2024-04-19 VITALS — BP 158/81 | HR 78 | Temp 98.1°F | Resp 16 | Ht 63.5 in | Wt 183.1 lb

## 2024-04-19 DIAGNOSIS — E118 Type 2 diabetes mellitus with unspecified complications: Secondary | ICD-10-CM | POA: Diagnosis not present

## 2024-04-19 DIAGNOSIS — Z0000 Encounter for general adult medical examination without abnormal findings: Secondary | ICD-10-CM

## 2024-04-19 DIAGNOSIS — R3589 Other polyuria: Secondary | ICD-10-CM

## 2024-04-19 DIAGNOSIS — E782 Mixed hyperlipidemia: Secondary | ICD-10-CM

## 2024-04-19 DIAGNOSIS — I10 Essential (primary) hypertension: Secondary | ICD-10-CM

## 2024-04-19 LAB — CBC WITH DIFFERENTIAL/PLATELET
Basophils Absolute: 0.1 x10E3/uL (ref 0.0–0.2)
Basos: 0 %
EOS (ABSOLUTE): 0.2 x10E3/uL (ref 0.0–0.4)
Eos: 1 %
Hematocrit: 45.6 % (ref 34.0–46.6)
Hemoglobin: 14.3 g/dL (ref 11.1–15.9)
Immature Grans (Abs): 0 x10E3/uL (ref 0.0–0.1)
Immature Granulocytes: 0 %
Lymphocytes Absolute: 3.6 x10E3/uL — ABNORMAL HIGH (ref 0.7–3.1)
Lymphs: 28 %
MCH: 29.9 pg (ref 26.6–33.0)
MCHC: 31.4 g/dL — ABNORMAL LOW (ref 31.5–35.7)
MCV: 95 fL (ref 79–97)
Monocytes Absolute: 0.9 x10E3/uL (ref 0.1–0.9)
Monocytes: 7 %
Neutrophils Absolute: 8.2 x10E3/uL — ABNORMAL HIGH (ref 1.4–7.0)
Neutrophils: 64 %
Platelets: 221 x10E3/uL (ref 150–450)
RBC: 4.79 x10E6/uL (ref 3.77–5.28)
RDW: 12.9 % (ref 11.7–15.4)
WBC: 13 x10E3/uL — ABNORMAL HIGH (ref 3.4–10.8)

## 2024-04-19 LAB — POCT URINALYSIS DIPSTICK
Bilirubin, UA: NEGATIVE
Glucose, UA: NEGATIVE
Ketones, UA: NEGATIVE
Leukocytes, UA: NEGATIVE
Nitrite, UA: NEGATIVE
Protein, UA: NEGATIVE
Spec Grav, UA: 1.015 (ref 1.010–1.025)
Urobilinogen, UA: 0.2 U/dL
pH, UA: 6 (ref 5.0–8.0)

## 2024-04-19 LAB — LIPID PANEL
Chol/HDL Ratio: 2.3 ratio (ref 0.0–4.4)
Cholesterol, Total: 145 mg/dL (ref 100–199)
HDL: 62 mg/dL (ref >39)
LDL Chol Calc (NIH): 63 mg/dL (ref 0–99)
Triglycerides: 110 mg/dL (ref 0–149)
VLDL Cholesterol Cal: 20 mg/dL (ref 5–40)

## 2024-04-19 LAB — COMPREHENSIVE METABOLIC PANEL WITH GFR
ALT: 15 IU/L (ref 0–32)
AST: 19 IU/L (ref 0–40)
Albumin: 4.4 g/dL (ref 3.9–4.9)
Alkaline Phosphatase: 113 IU/L (ref 44–121)
BUN/Creatinine Ratio: 13 (ref 12–28)
BUN: 13 mg/dL (ref 8–27)
Bilirubin Total: 0.5 mg/dL (ref 0.0–1.2)
CO2: 23 mmol/L (ref 20–29)
Calcium: 9.7 mg/dL (ref 8.7–10.3)
Chloride: 97 mmol/L (ref 96–106)
Creatinine, Ser: 0.98 mg/dL (ref 0.57–1.00)
Globulin, Total: 2 g/dL (ref 1.5–4.5)
Glucose: 90 mg/dL (ref 70–99)
Potassium: 4.4 mmol/L (ref 3.5–5.2)
Sodium: 137 mmol/L (ref 134–144)
Total Protein: 6.4 g/dL (ref 6.0–8.5)
eGFR: 62 mL/min/1.73 (ref >59)

## 2024-04-19 LAB — HEMOGLOBIN A1C
Est. average glucose Bld gHb Est-mCnc: 128 mg/dL
Hgb A1c MFr Bld: 6.1 % — ABNORMAL HIGH (ref 4.8–5.6)

## 2024-04-19 MED ORDER — FLUZONE HIGH-DOSE 0.5 ML IM SUSY
0.5000 mL | PREFILLED_SYRINGE | Freq: Once | INTRAMUSCULAR | 0 refills | Status: AC
Start: 2024-04-19 — End: 2024-04-20
  Filled 2024-04-19: qty 0.5, 1d supply, fill #0

## 2024-04-19 NOTE — Progress Notes (Signed)
 Complete physical exam  Patient: Kaitlyn Rosales   DOB: 17-May-1956   [name]  MRN: [id number]  Subjective:    Chief Complaint  Patient presents with   Medical Management of Chronic Issues    Follow up. Buspirone  not helping. Does not want to continue if not helping. Taking valium  prn when buspirone  is not helping.     Annual Exam    Annual exam.    Discussed the use of AI scribe software for clinical note transcription with the patient, who gave verbal consent to proceed.  History of Present Illness   Kaitlyn Rosales is a 68 year old female who presents for an annual physical exam.  She has a history of esophageal ulcer, which she manages by consuming ice cream to soothe her throat. She has not been tested for H. pylori recently and is awaiting an appointment with her gastroenterologist. She recalls a previous endoscopy where a non-cancerous lesion was removed from her esophagus.  She is scheduled for a low-dose CT scan of her chest in November, ordered by her pulmonologist. Her breathing medication tends to wear off after a few hours.  Her diet includes ice cream, chicken, flounder, shrimp, and occasional red meat. She has reduced smoking and finds food more palatable. She recently celebrated her birthday with family at a steakhouse but was dissatisfied with the steak's preparation.  She experiences sleeping issues, often waking up due to pain, and typically sleeps from 9:30 PM to 5:30 AM. She experiences vertigo, which is exacerbated by certain head movements, and has been to the hospital for it. She uses medication to manage vertigo symptoms.  She has a history of high blood pressure, which was elevated during the visit, possibly due to recent stress from the loss of a family pet. At home, her blood pressure is usually stable. She has been experiencing increased urinary frequency, needing to urinate three to four times a night, and is concerned about potential  accidents.  Most recent fall risk assessment:    03/17/2024   11:13 AM  Fall Risk   Falls in the past year? 1  Number falls in past yr: 0  Injury with Fall? 0  Risk for fall due to : No Fall Risks  Follow up Falls evaluation completed;Education provided;Falls prevention discussed     Most recent depression screenings:    04/19/2024   10:13 AM 03/17/2024   11:11 AM  PHQ 2/9 Scores  PHQ - 2 Score 0 0    Patient Active Problem List   Diagnosis Date Noted   Senile purpura (HCC) 01/18/2024   Osteopenia 01/18/2024   Primary hypertension 01/18/2024   Panic attack 12/07/2023   Seasonal allergies 12/07/2023   Palpitations 12/07/2023   Allergic reaction 08/04/2021   COPD with acute exacerbation (HCC) 08/03/2021   Lumbar spondylosis 04/09/2021   OAB (overactive bladder) 04/09/2021   COPD (chronic obstructive pulmonary disease) (HCC) 03/07/2021   History of MI (myocardial infarction) 03/07/2021   Hypovitaminosis D 03/07/2021   Chronic respiratory failure with hypoxia (HCC) 01/09/2021   OSA (obstructive sleep apnea) 01/09/2021   Smoking greater than 40 pack years 01/09/2021   Intracranial aneurysm 08/22/2019   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   Other dysphagia 03/01/2019   S/P cervical spinal fusion 02/05/2019   Cervical spinal stenosis 10/08/2018   High risk medication use 09/22/2018   Mixed hyperlipidemia 10/29/2017   Tobacco abuse 09/08/2017   Cigarette nicotine dependence with nicotine-induced disorder 09/08/2017  Controlled type 2 diabetes mellitus with complication, without long-term current use of insulin (HCC) 07/31/2017   Gastroesophageal reflux disease without esophagitis 05/05/2017   Posttraumatic stress disorder 10/15/2016   Past Medical History:  Diagnosis Date   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 Added automatically from request for surgery [id number]   Chronic respiratory failure with hypoxia (HCC) 01/09/2021   Gastroesophageal reflux disease without esophagitis 05/05/2017   History of MI (myocardial infarction) 03/07/2021   Formatting of this note might be different from the original. living in [place], never cathed (early 40s ~) patient reports MD said light heart attack   Mixed hyperlipidemia 10/29/2017   OSA (obstructive sleep apnea) 01/09/2021   Posttraumatic stress disorder 10/15/2016   Social History   Tobacco Use   Smoking status: Every Day    Current packs/day: 0.50    Average packs/day: 0.5 packs/day for 57.7 years (28.8 ttl pk-yrs)    Types: Cigarettes    Start date: 1968    Passive exposure: Current   Smokeless tobacco: Never  Vaping Use   Vaping status: Never Used  Substance Use Topics   Alcohol use: Not Currently   Drug use: Never   Allergies  Allergen Reactions   Amoxicillin -Pot Clavulanate Hives    Took benadryl  and symptoms resolved in 2 days. No angioedema.   Azithromycin  Shortness Of Breath    Throat closing    Doxycycline  Anaphylaxis   Levofloxacin Itching and Rash   Ceftriaxone  Other (See Comments)    coughing, difficulty swallowing, rash - unclear if caused by ceftriaxone  - has tolerated cefazolin and cephalexin  previously   Acetaminophen    Clavulanic Acid    Nitrofurantoin Other (See Comments), Swelling and Hives    Required hospitalization   Oxycodone-Acetaminophen Other (See Comments)    Auditory hallucinations, unable to sleep   Prednisone  Swelling    CANNOT DO PILLS. Swelling in mouth.   Tape Dermatitis    Paper tape is fine   Trimethoprim    Sulfamethoxazole-Trimethoprim Rash      Patient Care Team: Krystyl Cannell T, [name] as PCP - General (Physician Assistant)   Outpatient Medications Prior to Visit  Medication Sig   acetaminophen (TYLENOL)  325 MG tablet Take 325 mg by mouth as needed (prn).   albuterol  (VENTOLIN  HFA) 108 (90 Base) MCG/ACT inhaler Inhale 2 puffs into the lungs every 6 (six) hours as needed.   Alum & Mag Hydroxide-Simeth (ANTACID LIQUID PO) Take by mouth as needed.   atorvastatin  (LIPITOR) 40 MG tablet Take 1 tablet (40 mg total) by mouth daily for cholesterol   Blood Glucose Monitoring Suppl (ACCU-CHEK GUIDE ME) w/Device KIT by Does not apply route.   busPIRone  (BUSPAR ) 7.5 MG tablet Take 1 tablet (7.5 mg total) by mouth 2 (two) times daily.   cetirizine  (ZYRTEC ) 10 MG tablet Take 1 tablet (10 mg total) by mouth daily. (Patient taking differently: Take 10 mg by mouth as needed.)   Cholecalciferol (VITAMIN D3) 250 MCG (10000 UT) TABS Take 10,000 Units by mouth daily.   diazepam  (VALIUM ) 5 MG tablet Take 0.5 tablets (2.5 mg total) by mouth every 6 (six) hours as needed for anxiety. (Patient taking differently: Take 2.5 mg by mouth as needed for anxiety.)   EPINEPHrine  (EPIPEN  2-PAK) 0.3 mg/0.3 mL IJ SOAJ injection Inject  0.3 mg into the muscle as needed for anaphylaxis.   glucose blood (ACCU-CHEK GUIDE TEST) test strip Use to check glucose 3 times daily as instructed   guaiFENesin (MUCINEX) 600 MG 12 hr tablet Take 1,200 mg by mouth as needed.   ipratropium-albuterol  (DUONEB) 0.5-2.5 (3) MG/3ML SOLN Take 3 mLs by nebulization every 6 (six) hours.   meclizine (ANTIVERT) 25 MG tablet Take 25 mg by mouth every 6 (six) hours. (Patient taking differently: Take 25 mg by mouth as needed.)   mupirocin  ointment (BACTROBAN ) 2 % Apply 1 Application topically 2 (two) times daily.   omeprazole (PRILOSEC) 20 MG capsule Take 20 mg by mouth daily.   ondansetron  (ZOFRAN -ODT) 4 MG disintegrating tablet Take 4 mg by mouth every 8 (eight) hours as needed.   OXYGEN  2 L by Does not apply route at bedtime.   Tiotropium Bromide -Olodaterol (STIOLTO RESPIMAT ) 2.5-2.5 MCG/ACT AERS Inhale 2 puffs into the lungs daily.   No facility-administered  medications prior to visit.    ROS  Per HPI     Objective:     BP (!) 146/92   Pulse 78   Temp 98.1 F (36.7 C) (Oral)   Resp 16   Ht 5' 3.5 (1.613 m)   Wt 183 lb 1.6 oz (83.1 kg)   LMP  (LMP Unknown)   SpO2 94%   BMI 31.93 kg/m  BP Readings from Last 3 Encounters:  04/19/24 (!) 158/81  03/18/24 139/75  03/17/24 130/88   Wt Readings from Last 3 Encounters:  04/19/24 183 lb 1.6 oz (83.1 kg)  03/18/24 183 lb 4.8 oz (83.1 kg)  03/17/24 182 lb (82.6 kg)      Physical Exam Constitutional:      General: She is not in acute distress.    Appearance: Normal appearance. She is not ill-appearing or diaphoretic.  HENT:     Head: Normocephalic and atraumatic.     Right Ear: Tympanic membrane, ear canal and external ear normal.     Left Ear: Tympanic membrane, ear canal and external ear normal.     Nose: Nose normal.     Mouth/Throat:     Mouth: Mucous membranes are moist.     Pharynx: Oropharynx is clear.  Eyes:     General: No scleral icterus.       Right eye: No discharge.        Left eye: No discharge.     Extraocular Movements: Extraocular movements intact.     Conjunctiva/sclera: Conjunctivae normal.     Pupils: Pupils are equal, round, and reactive to light.  Neck:     Thyroid : No thyroid  mass, thyromegaly or thyroid  tenderness.     Vascular: No carotid bruit.  Cardiovascular:     Rate and Rhythm: Normal rate and regular rhythm.     Pulses: Normal pulses.     Heart sounds: Normal heart sounds. No murmur heard.    No friction rub. No gallop.  Pulmonary:     Effort: Pulmonary effort is normal.     Breath sounds: Normal breath sounds. No wheezing, rhonchi or rales.  Chest:     Chest wall: No tenderness.  Abdominal:     General: Bowel sounds are normal. There is no distension.     Palpations: Abdomen is soft.     Tenderness: There is no abdominal tenderness. There is no guarding.  Musculoskeletal:        General: No swelling, deformity or signs of injury.      Cervical  back: Neck supple.     Right lower leg: No edema.     Left lower leg: No edema.  Lymphadenopathy:     Cervical: No cervical adenopathy.     Right cervical: No superficial or posterior cervical adenopathy.    Left cervical: No superficial cervical adenopathy.  Skin:    Coloration: Skin is not jaundiced.     Findings: No rash.  Neurological:     General: No focal deficit present.     Mental Status: She is alert and oriented to person, place, and time.     Motor: No weakness.  Psychiatric:        Behavior: Behavior normal.      No results found for any visits on 04/19/24. Last CBC Lab Results  Component Value Date   WBC 12.1 (H) 12/07/2023   HGB 14.4 12/07/2023   HCT 43.5 12/07/2023   MCV 92 12/07/2023   MCH 30.6 12/07/2023   RDW 12.0 12/07/2023   PLT 206 12/07/2023   Last metabolic panel Lab Results  Component Value Date   GLUCOSE 91 12/07/2023   NA 139 12/07/2023   K 4.4 12/07/2023   CL 101 12/07/2023   CO2 26 12/07/2023   BUN 12 12/07/2023   CREATININE 0.91 12/07/2023   EGFR 69 12/07/2023   CALCIUM  9.7 12/07/2023   PROT 6.5 12/07/2023   ALBUMIN 4.3 12/07/2023   LABGLOB 2.2 12/07/2023   AGRATIO 1.6 10/28/2021   BILITOT 0.4 12/07/2023   ALKPHOS 111 12/07/2023   AST 15 12/07/2023   ALT 11 12/07/2023   Last lipids Lab Results  Component Value Date   CHOL 154 01/18/2024   HDL 67 01/18/2024   LDLCALC 65 01/18/2024   TRIG 125 01/18/2024   CHOLHDL 2.3 01/18/2024   Last hemoglobin A1c Lab Results  Component Value Date   HGBA1C 5.9 (H) 01/18/2024        Assessment & Plan:    Routine Health Maintenance and Physical Exam  Health Maintenance  Topic Date Due   Screening for Lung Cancer  Never done   Complete foot exam   03/07/2022   Flu Shot  04/25/2024*   Eye exam for diabetics  05/25/2025*   Hemoglobin A1C  07/19/2024   Yearly kidney function blood test for diabetes  12/06/2024   Yearly kidney health urinalysis for diabetes  01/17/2025    Medicare Annual Wellness Visit  03/17/2025   Mammogram  01/31/2026   Colon Cancer Screening  12/26/2028   DTaP/Tdap/Td vaccine (2 - Td or Tdap) 10/03/2031   DEXA scan (bone density measurement)  Completed   HPV Vaccine  Aged Out   Meningitis B Vaccine  Aged Out   Pneumococcal Vaccine for age over 41  Discontinued   COVID-19 Vaccine  Discontinued   Hepatitis C Screening  Discontinued   Zoster (Shingles) Vaccine  Discontinued  *Topic was postponed. The date shown is not the original due date.   Assessment and Plan    Adult Wellness Visit Annual physical examination conducted. Discussed diet, exercise, sleep patterns, and stress related to recent family pet loss. - Proceed with scheduled low dose CT of the chest in November - Encourage balanced diet with adequate fiber intake  Type 2 diabetes mellitus Chronic, stable.  Due for A1c testing. Reports increased frequency of urination, possibly related to diabetes management. - Order A1c test - Perform urinalysis to rule out UTI  Polyuria Reports increased frequency of urination, especially at night. No pain with urination. - Perform urinalysis  to rule out UTI.  Relatively normal aside from trace blood  Chronic obstructive pulmonary disease (COPD) Chronic, relatively stable.  Reports wheezing and medication wearing off after 2-3 hours. Scheduled follow-up with pulmonologist in December to review CT results and adjust medications if necessary. - Follow up with pulmonologist in December  Resistant GERD Esophageal ulcer with previous endoscopy and biopsy. Reports relief from symptoms with ice cream. No recent H. pylori testing. - Follow up with gastroenterologist - Consider urea breath test if GI appointment is delayed  Hypertension Chronic, not at goal today. Blood pressure elevated at 158/84 during visit, possibly due to stress. Reports normal readings at home. Recent stress due to family pet loss. - Monitor blood pressure at  home - Follow up in 3 months to reassess blood pressure     Return in about 3 months (around 07/19/2024) for DM, HTN.     Aryan Sparks T Daylani Deblois, [name]

## 2024-04-19 NOTE — Patient Instructions (Addendum)
 It was nice to see you today!  If you have any problems before your next visit feel free to message me via MyChart (minor issues or questions) or call the office, otherwise you may reach out to schedule an office visit.  Thank you! Kadrian Partch, [name]   Health Maintenance, Female Adopting a healthy lifestyle and getting preventive care are important in promoting health and wellness. Ask your health care provider about: The right schedule for you to have regular tests and exams. Things you can do on your own to prevent diseases and keep yourself healthy. What should I know about diet, weight, and exercise? Eat a healthy diet  Eat a diet that includes plenty of vegetables, fruits, low-fat dairy products, and lean protein. Do not eat a lot of foods that are high in solid fats, added sugars, or sodium. Maintain a healthy weight Body mass index (BMI) is used to identify weight problems. It estimates body fat based on height and weight. Your health care provider can help determine your BMI and help you achieve or maintain a healthy weight. Get regular exercise Get regular exercise. This is one of the most important things you can do for your health. Most adults should: Exercise for at least 150 minutes each week. The exercise should increase your heart rate and make you sweat (moderate-intensity exercise). Do strengthening exercises at least twice a week. This is in addition to the moderate-intensity exercise. Spend less time sitting. Even light physical activity can be beneficial. Watch cholesterol and blood lipids Have your blood tested for lipids and cholesterol at 69 years of age, then have this test every 5 years. Have your cholesterol levels checked more often if: Your lipid or cholesterol levels are high. You are older than 69 years of age. You are at high risk for heart disease. What should I know about cancer screening? Depending on your health history and family history, you may  need to have cancer screening at various ages. This may include screening for: Breast cancer. Cervical cancer. Colorectal cancer. Skin cancer. Lung cancer. What should I know about heart disease, diabetes, and high blood pressure? Blood pressure and heart disease High blood pressure causes heart disease and increases the risk of stroke. This is more likely to develop in people who have high blood pressure readings or are overweight. Have your blood pressure checked: Every 3-5 years if you are 58-8 years of age. Every year if you are 62 years old or older. Diabetes Have regular diabetes screenings. This checks your fasting blood sugar level. Have the screening done: Once every three years after age 66 if you are at a normal weight and have a low risk for diabetes. More often and at a younger age if you are overweight or have a high risk for diabetes. What should I know about preventing infection? Hepatitis B If you have a higher risk for hepatitis B, you should be screened for this virus. Talk with your health care provider to find out if you are at risk for hepatitis B infection. Hepatitis C Testing is recommended for: Everyone born from [birth year] through 1966. Anyone with known risk factors for hepatitis C. Sexually transmitted infections (STIs) Get screened for STIs, including gonorrhea and chlamydia, if: You are sexually active and are younger than 69 years of age. You are older than 69 years of age and your health care provider tells you that you are at risk for this type of infection. Your sexual activity has changed since you  were last screened, and you are at increased risk for chlamydia or gonorrhea. Ask your health care provider if you are at risk. Ask your health care provider about whether you are at high risk for HIV. Your health care provider may recommend a prescription medicine to help prevent HIV infection. If you choose to take medicine to prevent HIV, you should first get  tested for HIV. You should then be tested every 3 months for as long as you are taking the medicine. Pregnancy If you are about to stop having your period (premenopausal) and you may become pregnant, seek counseling before you get pregnant. Take 400 to 800 micrograms (mcg) of folic acid every day if you become pregnant. Ask for birth control (contraception) if you want to prevent pregnancy. Osteoporosis and menopause Osteoporosis is a disease in which the bones lose minerals and strength with aging. This can result in bone fractures. If you are 75 years old or older, or if you are at risk for osteoporosis and fractures, ask your health care provider if you should: Be screened for bone loss. Take a calcium or vitamin D  supplement to lower your risk of fractures. Be given hormone replacement therapy (HRT) to treat symptoms of menopause. Follow these instructions at home: Alcohol use Do not drink alcohol if: Your health care provider tells you not to drink. You are pregnant, may be pregnant, or are planning to become pregnant. If you drink alcohol: Limit how much you have to: 0-1 drink a day. Know how much alcohol is in your drink. In the [place] one drink equals one 12 oz bottle of beer (355 mL), one 5 oz glass of wine (148 mL), or one 1 oz glass of hard liquor (44 mL). Lifestyle Do not use any products that contain nicotine or tobacco. These products include cigarettes, chewing tobacco, and vaping devices, such as e-cigarettes. If you need help quitting, ask your health care provider. Do not use street drugs. Do not share needles. Ask your health care provider for help if you need support or information about quitting drugs. General instructions Schedule regular health, dental, and eye exams. Stay current with your vaccines. Tell your health care provider if: You often feel depressed. You have ever been abused or do not feel safe at home. Summary Adopting a healthy lifestyle and getting  preventive care are important in promoting health and wellness. Follow your health care provider's instructions about healthy diet, exercising, and getting tested or screened for diseases. Follow your health care provider's instructions on monitoring your cholesterol and blood pressure. This information is not intended to replace advice given to you by your health care provider. Make sure you discuss any questions you have with your health care provider. Document Revised: 12/17/2020 Document Reviewed: 12/17/2020 Elsevier Patient Education  2024 Elsevier Inc. American Heart Association [facility]) Exercise Recommendation  Being physically active is important to prevent heart disease and stroke, the nation's No. 1and No. 5killers. To improve overall cardiovascular health, we suggest at least 150 minutes per week of moderate exercise or 75 minutes per week of vigorous exercise (or a combination of moderate and vigorous activity). Thirty minutes a day, five times a week is an easy goal to remember. You will also experience benefits even if you divide your time into two or three segments of 10 to 15 minutes per day.  For people who would benefit from lowering their blood pressure or cholesterol, we recommend 40 minutes of aerobic exercise of moderate to vigorous intensity three  to four times a week to lower the risk for heart attack and stroke.  Physical activity is anything that makes you move your body and burn calories.  This includes things like climbing stairs or playing sports. Aerobic exercises benefit your heart, and include walking, jogging, swimming or biking. Strength and stretching exercises are best for overall stamina and flexibility.  The simplest, positive change you can make to effectively improve your heart health is to start walking. It's enjoyable, free, easy, social and great exercise. A walking program is flexible and boasts high success rates because people can stick with it. It's easy  for walking to become a regular and satisfying part of life.   For Overall Cardiovascular Health: At least 30 minutes of moderate-intensity aerobic activity at least 5 days per week for a total of 150  OR  At least 25 minutes of vigorous aerobic activity at least 3 days per week for a total of 75 minutes; or a combination of moderate- and vigorous-intensity aerobic activity  AND  Moderate- to high-intensity muscle-strengthening activity at least 2 days per week for additional health benefits.  For Lowering Blood Pressure and Cholesterol An average 40 minutes of moderate- to vigorous-intensity aerobic activity 3 or 4 times per week  What if I can't make it to the time goal? Something is always better than nothing! And everyone has to start somewhere. Even if you've been sedentary for years, today is the day you can begin to make healthy changes in your life. If you don't think you'll make it for 30 or 40 minutes, set a reachable goal for today. You can work up toward your overall goal by increasing your time as you get stronger. Don't let all-or-nothing thinking rob you of doing what you can every day.  Source:http://www.heart.org

## 2024-04-19 NOTE — Progress Notes (Deleted)
   Established Patient Office Visit  Subjective   Patient ID: Kaitlyn Rosales, female    DOB: 03-19-1956  Age: 68 y.o. MRN: [id number]  Chief Complaint  Patient presents with   Medical Management of Chronic Issues    Follow up. Buspirone  not helping. Does not want to continue if not helping. Taking valium  prn when buspirone  is not helping.     Annual Exam    Annual exam.    HPI  Discussed the use of AI scribe software for clinical note transcription with the patient, who gave verbal consent to proceed.  History of Present Illness            {History (Optional):23778}  ROS Per HPI.    Objective:     BP (!) 146/92   Pulse 78   Temp 98.1 F (36.7 C) (Oral)   Resp 16   Ht 5' 3.5 (1.613 m)   Wt 183 lb 1.6 oz (83.1 kg)   LMP  (LMP Unknown)   SpO2 94%   BMI 31.93 kg/m  {Vitals History (Optional):23777}  Physical Exam   No results found for any visits on 04/19/24.  {Labs (Optional):23779}  The ASCVD Risk score (Arnett DK, et al., 2019) failed to calculate for the following reasons:   Risk score cannot be calculated because patient has a medical history suggesting prior/existing ASCVD    Assessment & Plan:   There are no diagnoses linked to this encounter.  Assessment and Plan              No follow-ups on file.    Pearlie Nies T Hadassah Rana, [name]

## 2024-04-24 ENCOUNTER — Ambulatory Visit (HOSPITAL_BASED_OUTPATIENT_CLINIC_OR_DEPARTMENT_OTHER)
Admission: EM | Admit: 2024-04-24 | Discharge: 2024-04-24 | Disposition: A | Discharge status: Home / Self Care | Attending: Family Medicine | Admitting: Family Medicine

## 2024-04-24 ENCOUNTER — Encounter (HOSPITAL_BASED_OUTPATIENT_CLINIC_OR_DEPARTMENT_OTHER): Payer: Self-pay | Admitting: Student

## 2024-04-24 ENCOUNTER — Encounter (HOSPITAL_BASED_OUTPATIENT_CLINIC_OR_DEPARTMENT_OTHER): Payer: Self-pay

## 2024-04-24 DIAGNOSIS — L03116 Cellulitis of left lower limb: Secondary | ICD-10-CM | POA: Diagnosis not present

## 2024-04-24 DIAGNOSIS — S81812A Laceration without foreign body, left lower leg, initial encounter: Secondary | ICD-10-CM | POA: Diagnosis not present

## 2024-04-24 MED ORDER — AMOXICILLIN 875 MG PO TABS: 875.0000 mg | ORAL_TABLET | Freq: Two times a day (BID) | ORAL | 0 refills | Status: DC

## 2024-04-24 MED ORDER — CEPHALEXIN 500 MG PO CAPS: 500.0000 mg | ORAL_CAPSULE | Freq: Three times a day (TID) | ORAL | 0 refills | Status: DC

## 2024-04-24 NOTE — ED Provider Notes (Signed)
 [facility]    CSN: [id number] Arrival date & time: 04/24/24  9062      History   Chief Complaint Chief Complaint  Patient presents with   Skin tear    HPI Kaitlyn Rosales is a 68 y.o. female.   Patient is a 68 year old female who presents today with skin tear to left lower leg. Medial side of ankle. Edges pulled together well. Tissue around pink. Patient states it has been healing well but it is tender and sore and wants to make sure it doesn't need an antibiotic before she goes on vacation.  The redness around the area has been spreading downwards into the foot.  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 Added automatically from request for surgery [phone]   Chronic respiratory failure with hypoxia (HCC) 01/09/2021   Gastroesophageal reflux disease without esophagitis 05/05/2017   History of MI (myocardial infarction) 03/07/2021   Formatting of this note might be different from the original. living in [place], never cathed (early 40s ~) patient reports MD said light heart attack   Mixed hyperlipidemia 10/29/2017   OSA (obstructive sleep apnea) 01/09/2021   Posttraumatic stress disorder 10/15/2016    Patient Active Problem List   Diagnosis Date Noted   Senile purpura (HCC) 01/18/2024   Osteopenia 01/18/2024   Primary hypertension 01/18/2024   Panic attack 12/07/2023   Seasonal allergies 12/07/2023   Palpitations 12/07/2023   Allergic reaction 08/04/2021   COPD with acute exacerbation (HCC) 08/03/2021   Lumbar spondylosis 04/09/2021   OAB (overactive bladder) 04/09/2021   COPD (chronic obstructive pulmonary disease) (HCC) 03/07/2021   History of MI (myocardial infarction) 03/07/2021   Hypovitaminosis D  03/07/2021   Chronic respiratory failure with hypoxia (HCC) 01/09/2021   OSA (obstructive sleep apnea) 01/09/2021   Smoking greater than 40 pack years 01/09/2021   Intracranial aneurysm 08/22/2019   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   Other dysphagia 03/01/2019   S/P cervical spinal fusion 02/05/2019   Cervical spinal stenosis 10/08/2018   High risk medication use 09/22/2018   Mixed hyperlipidemia 10/29/2017   Tobacco abuse 09/08/2017   Cigarette nicotine dependence with nicotine-induced disorder 09/08/2017   Controlled type 2 diabetes mellitus with complication, without long-term current use of insulin (HCC) 07/31/2017   Gastroesophageal reflux disease without esophagitis 05/05/2017   Posttraumatic stress disorder 10/15/2016    Past Surgical History:  Procedure Laterality Date   APPENDECTOMY  1970   CERVICAL SPINE SURGERY  2020   CHOLECYSTECTOMY  1970   ELBOW SURGERY Right 1990   PARTIAL HYSTERECTOMY  1990    OB History     Gravida  3   Para  3   Term  2   Preterm  1   AB  0   Living  2      SAB      IAB      Ectopic      Multiple      Live Births               Home Medications    Prior to Admission medications   Medication Sig Start Date End Date Taking? Authorizing Provider  cephALEXin  (KEFLEX ) 500 MG  capsule Take 1 capsule (500 mg total) by mouth 3 (three) times daily for 7 days. 04/24/24 05/01/24 Yes Kamaal Cast A, FNP  acetaminophen (TYLENOL) 325 MG tablet Take 325 mg by mouth as needed (prn).    [provider]  albuterol  (VENTOLIN  HFA) 108 (90 Base) MCG/ACT inhaler Inhale 2 puffs into the lungs every 6 (six) hours as needed. 04/12/24   Rothfuss, Jacob T, [name]  Alum & Mag Hydroxide-Simeth (ANTACID LIQUID PO) Take by mouth as needed.    [provider]  atorvastatin  (LIPITOR) 40 MG tablet Take 1 tablet (40 mg total) by mouth daily for cholesterol 06/03/23     Blood Glucose Monitoring Suppl  (ACCU-CHEK GUIDE ME) w/Device KIT by Does not apply route.    [provider]  busPIRone  (BUSPAR ) 7.5 MG tablet Take 1 tablet (7.5 mg total) by mouth 2 (two) times daily. 12/07/23   Rothfuss, Jacob T, [name]  cetirizine  (ZYRTEC ) 10 MG tablet Take 1 tablet (10 mg total) by mouth daily. Patient taking differently: Take 10 mg by mouth as needed. 12/07/23     Cholecalciferol (VITAMIN D3) 250 MCG (10000 UT) TABS Take 10,000 Units by mouth daily. 01/20/19   [provider]  diazepam  (VALIUM ) 5 MG tablet Take 0.5 tablets (2.5 mg total) by mouth every 6 (six) hours as needed for anxiety. Patient taking differently: Take 2.5 mg by mouth as needed for anxiety. 03/28/24 04/27/24  Rothfuss, Jacob T, [name]  EPINEPHrine  (EPIPEN  2-PAK) 0.3 mg/0.3 mL IJ SOAJ injection Inject 0.3 mg into the muscle as needed for anaphylaxis. 03/18/24   Rothfuss, Jacob T, [name]  glucose blood (ACCU-CHEK GUIDE TEST) test strip Use to check glucose 3 times daily as instructed 01/18/24   Rothfuss, Jacob T, [name]  guaiFENesin (MUCINEX) 600 MG 12 hr tablet Take 1,200 mg by mouth as needed.    [provider]  ipratropium-albuterol  (DUONEB) 0.5-2.5 (3) MG/3ML SOLN Take 3 mLs by nebulization every 6 (six) hours. 02/23/24     meclizine (ANTIVERT) 25 MG tablet Take 25 mg by mouth every 6 (six) hours. Patient taking differently: Take 25 mg by mouth as needed. 04/25/22   [provider]  mupirocin  ointment (BACTROBAN ) 2 % Apply 1 Application topically 2 (two) times daily. 03/18/24   Rothfuss, Jacob T, [name]  omeprazole (PRILOSEC) 20 MG capsule Take 20 mg by mouth daily.    [provider]  ondansetron  (ZOFRAN -ODT) 4 MG disintegrating tablet Take 4 mg by mouth every 8 (eight) hours as needed. 04/25/22   [provider]  OXYGEN  2 L by Does not apply route at bedtime.    [provider]  Tiotropium Bromide -Olodaterol (STIOLTO RESPIMAT ) 2.5-2.5 MCG/ACT AERS Inhale 2 puffs into the lungs daily. 12/04/23        Family History Family History  Problem Relation Age of Onset   Dementia Mother    Parkinson's disease Mother    Cirrhosis Father    Osteoarthritis Sister    Breast cancer Sister    COPD Sister    COPD Brother     Social History Social History   Tobacco Use   Smoking status: Every Day    Current packs/day: 0.50    Average packs/day: 0.5 packs/day for 57.7 years (28.9 ttl pk-yrs)    Types: Cigarettes    Start date: 1968    Passive exposure: Current   Smokeless tobacco: Never  Vaping Use   Vaping status: Never Used  Substance Use Topics   Alcohol use: Not Currently  Drug use: Never     Allergies   Amoxicillin -pot clavulanate, Azithromycin , Doxycycline , Levofloxacin, Ceftriaxone , Acetaminophen, Clavulanic acid, Nitrofurantoin, Oxycodone-acetaminophen, Prednisone , Tape, Trimethoprim, and Sulfamethoxazole-trimethoprim   Review of Systems Review of Systems See HPI  Physical Exam Triage Vital Signs ED Triage Vitals  Encounter Vitals Group     BP 04/24/24 1130 (!) 162/84     Girls Systolic BP Percentile --      Girls Diastolic BP Percentile --      Boys Systolic BP Percentile --      Boys Diastolic BP Percentile --      Pulse Rate 04/24/24 1130 84     Resp 04/24/24 1130 20     Temp 04/24/24 1130 98.2 F (36.8 C)     Temp Source 04/24/24 1130 Oral     SpO2 04/24/24 1130 96 %     Weight --      Height --      Head Circumference --      Peak Flow --      Pain Score 04/24/24 1132 3     Pain Loc --      Pain Education --      Exclude from Growth Chart --    No data found.  Updated Vital Signs BP (!) 162/84 (BP Location: Right Arm)   Pulse 84   Temp 98.2 F (36.8 C) (Oral)   Resp 20   LMP  (LMP Unknown)   SpO2 96%   Visual Acuity Right Eye Distance:   Left Eye Distance:   Bilateral Distance:    Right Eye Near:   Left Eye Near:    Bilateral Near:     Physical Exam Vitals and nursing note reviewed.  Constitutional:      General: She is not  in acute distress.    Appearance: Normal appearance. She is not ill-appearing, toxic-appearing or diaphoretic.  Pulmonary:     Effort: Pulmonary effort is normal.  Skin:    General: Skin is warm and dry.     Comments: Skin tear noted to left medial ankle that appears to be healing well.  There is some surrounding cellulitis that is extending into the foot area.  Very tender to palpation.  Neurological:     Mental Status: She is alert.  Psychiatric:        Mood and Affect: Mood normal.      UC Treatments / Results  Labs (all labs ordered are listed, but only abnormal results are displayed) Labs Reviewed - No data to display  EKG   Radiology No results found.  Procedures Procedures (including critical care time)  Medications Ordered in UC Medications - No data to display  Initial Impression / Assessment and Plan / UC Course  I have reviewed the triage vital signs and the nursing notes.  Pertinent labs & imaging results that were available during my care of the patient were reviewed by me and considered in my medical decision making (see chart for details).     Skin tear with surrounding cellulitis.  Will go ahead and treat with antibiotics at this time.  Treating with Keflex .  Recommend keep area clean and let air dry when possible. Follow-up as needed Final Clinical Impressions(s) / UC Diagnoses   Final diagnoses:  Skin tear of lower leg without complication, left, initial encounter  Cellulitis of left lower extremity     Discharge Instructions      We will go ahead and treat with some antibiotics at this time.  Keep area clean and let let to air when possible.  Keep covered at other times.  Follow-up as needed    ED Prescriptions     Medication Sig Dispense Auth. Provider   cephALEXin  (KEFLEX ) 500 MG capsule Take 1 capsule (500 mg total) by mouth 3 (three) times daily for 7 days. 21 capsule Adah Wilbert LABOR, FNP      PDMP not reviewed this encounter.    Adah Wilbert LABOR, FNP 04/24/24 1159

## 2024-04-24 NOTE — Discharge Instructions (Addendum)
 We will go ahead and treat with some antibiotics at this time. Keep area clean and let let to air when possible.  Keep covered at other times.  Follow-up as needed

## 2024-04-24 NOTE — ED Triage Notes (Signed)
 Patient experienced a skin tear to left lower leg. Medial side of ankle. Edges pulled together well. Tissue around pink. Patient states it has been healing well but it is tender and sore and wants to make sure it doesn't need an antibiotic before she goes on vacation.

## 2024-04-25 ENCOUNTER — Other Ambulatory Visit (HOSPITAL_BASED_OUTPATIENT_CLINIC_OR_DEPARTMENT_OTHER): Payer: Self-pay

## 2024-04-25 ENCOUNTER — Ambulatory Visit (HOSPITAL_BASED_OUTPATIENT_CLINIC_OR_DEPARTMENT_OTHER)

## 2024-04-25 ENCOUNTER — Other Ambulatory Visit (HOSPITAL_BASED_OUTPATIENT_CLINIC_OR_DEPARTMENT_OTHER): Payer: Self-pay | Admitting: Student

## 2024-04-25 DIAGNOSIS — L0390 Cellulitis, unspecified: Secondary | ICD-10-CM

## 2024-04-25 MED ORDER — CLINDAMYCIN HCL 300 MG PO CAPS
300.0000 mg | ORAL_CAPSULE | Freq: Three times a day (TID) | ORAL | 0 refills | Status: AC
  Filled 2024-04-25: qty 15, 5d supply, fill #0

## 2024-04-25 MED ORDER — CEFDINIR 300 MG PO CAPS
300.0000 mg | ORAL_CAPSULE | Freq: Two times a day (BID) | ORAL | 0 refills | Status: DC
  Filled 2024-04-25: qty 20, 10d supply, fill #0

## 2024-04-26 ENCOUNTER — Other Ambulatory Visit (HOSPITAL_BASED_OUTPATIENT_CLINIC_OR_DEPARTMENT_OTHER): Payer: Self-pay | Admitting: Student

## 2024-04-26 DIAGNOSIS — D72820 Lymphocytosis (symptomatic): Secondary | ICD-10-CM

## 2024-04-29 ENCOUNTER — Other Ambulatory Visit (HOSPITAL_BASED_OUTPATIENT_CLINIC_OR_DEPARTMENT_OTHER): Payer: Self-pay

## 2024-05-05 ENCOUNTER — Other Ambulatory Visit: Payer: Self-pay | Admitting: Hematology and Oncology

## 2024-05-05 ENCOUNTER — Telehealth: Payer: Self-pay | Admitting: Hematology and Oncology

## 2024-05-05 ENCOUNTER — Other Ambulatory Visit (HOSPITAL_BASED_OUTPATIENT_CLINIC_OR_DEPARTMENT_OTHER): Payer: Self-pay

## 2024-05-05 ENCOUNTER — Other Ambulatory Visit: Payer: Self-pay

## 2024-05-05 ENCOUNTER — Inpatient Hospital Stay: Attending: Hematology and Oncology | Admitting: Hematology and Oncology

## 2024-05-05 ENCOUNTER — Encounter: Payer: Self-pay | Admitting: Hematology and Oncology

## 2024-05-05 ENCOUNTER — Inpatient Hospital Stay

## 2024-05-05 VITALS — BP 139/75 | HR 78 | Temp 98.1°F | Resp 18 | Ht 63.5 in | Wt 183.4 lb

## 2024-05-05 DIAGNOSIS — D72828 Other elevated white blood cell count: Secondary | ICD-10-CM

## 2024-05-05 DIAGNOSIS — N183 Chronic kidney disease, stage 3 unspecified: Secondary | ICD-10-CM | POA: Insufficient documentation

## 2024-05-05 DIAGNOSIS — Z79899 Other long term (current) drug therapy: Secondary | ICD-10-CM | POA: Diagnosis not present

## 2024-05-05 DIAGNOSIS — Z803 Family history of malignant neoplasm of breast: Secondary | ICD-10-CM | POA: Insufficient documentation

## 2024-05-05 DIAGNOSIS — D709 Neutropenia, unspecified: Secondary | ICD-10-CM

## 2024-05-05 DIAGNOSIS — F17210 Nicotine dependence, cigarettes, uncomplicated: Secondary | ICD-10-CM | POA: Diagnosis not present

## 2024-05-05 DIAGNOSIS — K2210 Ulcer of esophagus without bleeding: Secondary | ICD-10-CM

## 2024-05-05 LAB — CBC WITH DIFFERENTIAL (CANCER CENTER ONLY)
Abs Immature Granulocytes: 0.03 K/uL (ref 0.00–0.07)
Basophils Absolute: 0.1 K/uL (ref 0.0–0.1)
Basophils Relative: 1 %
Eosinophils Absolute: 0.2 K/uL (ref 0.0–0.5)
Eosinophils Relative: 2 %
HCT: 41.4 % (ref 36.0–46.0)
Hemoglobin: 13.2 g/dL (ref 12.0–15.0)
Immature Granulocytes: 0 %
Lymphocytes Relative: 27 %
Lymphs Abs: 2.7 K/uL (ref 0.7–4.0)
MCH: 30.1 pg (ref 26.0–34.0)
MCHC: 31.9 g/dL (ref 30.0–36.0)
MCV: 94.3 fL (ref 80.0–100.0)
Monocytes Absolute: 0.8 K/uL (ref 0.1–1.0)
Monocytes Relative: 8 %
Neutro Abs: 6.1 K/uL (ref 1.7–7.7)
Neutrophils Relative %: 62 %
Platelet Count: 201 K/uL (ref 150–400)
RBC: 4.39 MIL/uL (ref 3.87–5.11)
RDW: 13.8 % (ref 11.5–15.5)
WBC Count: 9.8 K/uL (ref 4.0–10.5)
nRBC: 0 % (ref 0.0–0.2)

## 2024-05-05 LAB — TSH: TSH: 2.46 u[IU]/mL (ref 0.350–4.500)

## 2024-05-05 LAB — CMP (CANCER CENTER ONLY)
ALT: 14 U/L (ref 0–44)
AST: 21 U/L (ref 15–41)
Albumin: 3.9 g/dL (ref 3.5–5.0)
Alkaline Phosphatase: 108 U/L (ref 38–126)
Anion gap: 11 (ref 5–15)
BUN: 12 mg/dL (ref 8–23)
CO2: 27 mmol/L (ref 22–32)
Calcium: 9.5 mg/dL (ref 8.9–10.3)
Chloride: 102 mmol/L (ref 98–111)
Creatinine: 0.99 mg/dL (ref 0.44–1.00)
GFR, Estimated: >60 mL/min (ref >60)
Glucose, Bld: 101 mg/dL — ABNORMAL HIGH (ref 70–99)
Potassium: 3.8 mmol/L (ref 3.5–5.1)
Sodium: 140 mmol/L (ref 135–145)
Total Bilirubin: 0.4 mg/dL (ref 0.0–1.2)
Total Protein: 6.7 g/dL (ref 6.5–8.1)

## 2024-05-05 LAB — IRON AND TIBC
Iron: 61 ug/dL (ref 28–170)
Saturation Ratios: 19 % (ref 10.4–31.8)
TIBC: 323 ug/dL (ref 250–450)
UIBC: 263 ug/dL

## 2024-05-05 LAB — VITAMIN B12: Vitamin B-12: 175 pg/mL — ABNORMAL LOW (ref 180–914)

## 2024-05-05 LAB — FOLATE: Folate: 6.1 ng/mL (ref >5.9)

## 2024-05-05 LAB — FERRITIN: Ferritin: 23 ng/mL (ref 11–307)

## 2024-05-05 NOTE — Telephone Encounter (Signed)
 Patient has been scheduled for follow-up visit per 05/05/24 LOS.  Pt given an appt calendar with date and time.

## 2024-05-06 NOTE — Progress Notes (Signed)
 [facility] [address] [place],  [place]  [address] [phone]  Clinic Day:  05/06/2024   Referring physician: Iven Lang DASEN, [name]  Patient Care Team: Patient Care Team: Rothfuss, Jacob T, [name] as PCP - General (Physician Assistant)   REASON FOR CONSULTATION:  Neutrophilia  HISTORY OF PRESENT ILLNESS:   Kaitlyn Rosales is a 68 y.o. female with a history of neutrophilia who is referred in consultation by Lang Iven, [name] for assessment and management. She states she has not been told in the past that she has had any abnormalities with her blood work. She denies fever, chills, nausea or vomiting. She has noted shortness of breath due to COPD. This is being managed by her pulmonologist. She also notes esophageal ulcers in the past. She is up to date on her colonoscopy/ endoscopy. She is also up to date on her mammogram. She no longer needs Pap smears. Her pulmonologist has ordered a low dose CT for screening.   Medical history includes history of MI, obstructive sleep apnea, respiratory failure with hypoxia, aneurysm, cervical spinal stenosis, mixed hyperlipidemia, HTN, CKD III, GERD, PTSD  Surgical history includes cervical spine surgery, partial hysterectomy, elbow surgery, appendectomy, cholecystectomy  Family history includes Parkinson's, cirrhosis, osteoarthritis, breast cancer (sister and aunt), COPD   REVIEW OF SYSTEMS:   Review of Systems  Constitutional: Negative.   HENT:  Negative.    Eyes: Negative.   Respiratory:  Positive for cough, shortness of breath and wheezing.   Cardiovascular: Negative.   Gastrointestinal:  Positive for diarrhea.  Endocrine: Negative.   Genitourinary: Negative.    Musculoskeletal: Negative.   Skin: Negative.   Neurological: Negative.   Hematological: Negative.   Psychiatric/Behavioral: Negative.       VITALS:   Blood pressure 139/75, pulse 78, temperature 98.1 F (36.7 C), temperature source Oral,  resp. rate 18, height 5' 3.5 (1.613 m), weight 183 lb 6.4 oz (83.2 kg), SpO2 94%.  Wt Readings from Last 3 Encounters:  05/05/24 183 lb 6.4 oz (83.2 kg)  04/19/24 183 lb 1.6 oz (83.1 kg)  03/18/24 183 lb 4.8 oz (83.1 kg)    Body mass index is 31.98 kg/m.  Performance status (ECOG): 1 - Symptomatic but completely ambulatory  PHYSICAL EXAM:   Physical Exam Constitutional:      Appearance: Normal appearance. She is normal weight.  HENT:     Head: Normocephalic and atraumatic.     Nose: Nose normal.     Mouth/Throat:     Mouth: Mucous membranes are moist.  Cardiovascular:     Rate and Rhythm: Normal rate and regular rhythm.     Pulses: Normal pulses.     Heart sounds: Normal heart sounds.  Pulmonary:     Effort: Pulmonary effort is normal.     Breath sounds: Normal breath sounds.  Abdominal:     Palpations: Abdomen is soft.  Musculoskeletal:        General: Normal range of motion.     Cervical back: Normal range of motion.  Skin:    General: Skin is warm and dry.  Neurological:     General: No focal deficit present.     Mental Status: She is alert.  Psychiatric:        Mood and Affect: Mood normal.        Behavior: Behavior normal.        Thought Content: Thought content normal.        Judgment: Judgment normal.  LABS:      Latest Ref Rng & Units 05/05/2024    9:37 AM 04/19/2024   11:09 AM 12/07/2023   11:20 AM  CBC  WBC 4.0 - 10.5 K/uL 9.8  13.0  12.1   Hemoglobin 12.0 - 15.0 g/dL 86.7  85.6  85.5   Hematocrit 36.0 - 46.0 % 41.4  45.6  43.5   Platelets 150 - 400 K/uL 201  221  206       Latest Ref Rng & Units 05/05/2024    9:37 AM 04/19/2024   11:09 AM 12/07/2023   11:20 AM  CMP  Glucose 70 - 99 mg/dL 898  90  91   BUN 8 - 23 mg/dL 12  13  12    Creatinine 0.44 - 1.00 mg/dL 9.00  9.01  9.08   Sodium 135 - 145 mmol/L 140  137  139   Potassium 3.5 - 5.1 mmol/L 3.8  4.4  4.4   Chloride 98 - 111 mmol/L 102  97  101   CO2 22 - 32 mmol/L 27  23  26     Calcium  8.9 - 10.3 mg/dL 9.5  9.7  9.7   Total Protein 6.5 - 8.1 g/dL 6.7  6.4  6.5   Total Bilirubin 0.0 - 1.2 mg/dL 0.4  0.5  0.4   Alkaline Phos 38 - 126 U/L 108  113  111   AST 15 - 41 U/L 21  19  15    ALT 0 - 44 U/L 14  15  11       No results found for: CEA1, CEA / No results found for: CEA1, CEA No results found for: PSA1 No results found for: CAN199 No results found for: CAN125  No results found for: TOTALPROTELP, ALBUMINELP, A1GS, A2GS, BETS, BETA2SER, GAMS, MSPIKE, SPEI Lab Results  Component Value Date   TIBC 323 05/05/2024   FERRITIN 23 05/05/2024   IRONPCTSAT 19 05/05/2024   No results found for: LDH  STUDIES:   No results found.    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 Added automatically from request for surgery [id number]   Chronic respiratory failure with hypoxia (HCC) 01/09/2021   Gastroesophageal reflux disease without esophagitis 05/05/2017   History of MI (myocardial infarction) 03/07/2021   Formatting of this note might be different from the original. living in [place], never cathed (early 40s ~) patient reports MD said light heart attack   Mixed hyperlipidemia 10/29/2017   OSA (obstructive sleep apnea) 01/09/2021   Posttraumatic stress disorder 10/15/2016    Past Surgical History:  Procedure Laterality Date   APPENDECTOMY  1970   CERVICAL SPINE SURGERY  2020   CHOLECYSTECTOMY  1970   ELBOW SURGERY Right 1990   PARTIAL HYSTERECTOMY  1990    Family History  Problem Relation Age of Onset   Dementia Mother    Parkinson's disease Mother    Cirrhosis Father    Osteoarthritis Sister    Breast cancer Sister    COPD Sister    COPD Brother     Social  History:  reports that she has been smoking cigarettes. She started smoking about 57 years ago. She has a 28.9 pack-year smoking history. She has been exposed to tobacco smoke.  She has never used smokeless tobacco. She reports that she does not currently use alcohol. She reports that she does not use drugs.The patient is alone  today.  Allergies:  Allergies  Allergen Reactions   Amoxicillin -Pot Clavulanate Hives    Took benadryl  and symptoms resolved in 2 days. No angioedema.   Azithromycin  Shortness Of Breath    Throat closing    Doxycycline  Anaphylaxis   Levofloxacin Itching and Rash   Ceftriaxone  Other (See Comments)    coughing, difficulty swallowing, rash - unclear if caused by ceftriaxone  - has tolerated cefazolin and cephalexin  previously   Acetaminophen    Clavulanic Acid    Nitrofurantoin Other (See Comments), Swelling and Hives    Required hospitalization   Oxycodone-Acetaminophen Other (See Comments)    Auditory hallucinations, unable to sleep   Prednisone  Swelling    CANNOT DO PILLS. Swelling in mouth.   Tape Dermatitis    Paper tape is fine   Trimethoprim    Sulfamethoxazole-Trimethoprim Rash    Current Medications: Current Outpatient Medications  Medication Sig Dispense Refill   acetaminophen (TYLENOL) 325 MG tablet Take 325 mg by mouth as needed (prn).     albuterol  (VENTOLIN  HFA) 108 (90 Base) MCG/ACT inhaler Inhale 2 puffs into the lungs every 6 (six) hours as needed. 6.7 g 1   Alum & Mag Hydroxide-Simeth (ANTACID LIQUID PO) Take by mouth as needed.     atorvastatin  (LIPITOR) 40 MG tablet Take 1 tablet (40 mg total) by mouth daily for cholesterol 90 tablet 3   Blood Glucose Monitoring Suppl (ACCU-CHEK GUIDE ME) w/Device KIT by Does not apply route.     busPIRone  (BUSPAR ) 7.5 MG tablet Take 1 tablet (7.5 mg total) by mouth 2 (two) times daily. 60 tablet 5   cetirizine  (ZYRTEC ) 10 MG tablet Take 1 tablet (10 mg total) by mouth daily. (Patient taking differently:  Take 10 mg by mouth as needed.) 30 tablet 11   Cholecalciferol (VITAMIN D3) 250 MCG (10000 UT) TABS Take 10,000 Units by mouth daily.     EPINEPHrine  (EPIPEN  2-PAK) 0.3 mg/0.3 mL IJ SOAJ injection Inject 0.3 mg into the muscle as needed for anaphylaxis. 2 each 0   glucose blood (ACCU-CHEK GUIDE TEST) test strip Use to check glucose 3 times daily as instructed 100 each 6   guaiFENesin (MUCINEX) 600 MG 12 hr tablet Take 1,200 mg by mouth as needed.     ipratropium-albuterol  (DUONEB) 0.5-2.5 (3) MG/3ML SOLN Take 3 mLs by nebulization every 6 (six) hours. 90 mL 3   meclizine (ANTIVERT) 25 MG tablet Take 25 mg by mouth every 6 (six) hours. (Patient taking differently: Take 25 mg by mouth as needed.)     mupirocin  ointment (BACTROBAN ) 2 % Apply 1 Application topically 2 (two) times daily. 22 g 0   omeprazole (PRILOSEC) 20 MG capsule Take 20 mg by mouth daily.     ondansetron  (ZOFRAN -ODT) 4 MG disintegrating tablet Take 4 mg by mouth every 8 (eight) hours as needed.     OXYGEN  2 L by Does not apply route at bedtime.     Tiotropium Bromide -Olodaterol (STIOLTO RESPIMAT ) 2.5-2.5 MCG/ACT AERS Inhale 2 puffs into the lungs daily. 4 g 5   No current facility-administered medications for this visit.     ASSESSMENT & PLAN:   Assessment:  Kaitlyn Rosales is a 68 y.o. female with history of elevated white count. Today, CBC reveals normal white count. We discussed numerous possibilities for elevated white count including inflammation, frequent infections and  stress. She does have a wound to her left ankle from a fall; however, this does not look infected and she notes it happened after her previous blood draw. She does note inflammation from an esophageal ulcer. This could account for her elevated white count. We have iron studies pending. I will have her return in 2 weeks for repeat evaluation.  Plan: 1.  CBC normal today. Will repeat in 2 weeks.   I discussed the assessment and treatment plan with the  patient.  The patient was provided an opportunity to ask questions and all were answered.  The patient agreed with the plan and demonstrated an understanding of the instructions.    Thank you for the referral    45 minutes was spent in patient care.  This included time spent preparing to see the patient (e.g., review of tests), obtaining and/or reviewing separately obtained history, counseling and educating the patient/family/caregiver, ordering medications, tests, or procedures; documenting clinical information in the electronic or other health record, independently interpreting results and communicating results to the patient/family/caregiver as well as coordination of care.      Eleanor DELENA Bach, NP   Family Nurse Practitioner - Board Certified.  [facility] [address] [phone]

## 2024-05-10 ENCOUNTER — Other Ambulatory Visit (HOSPITAL_BASED_OUTPATIENT_CLINIC_OR_DEPARTMENT_OTHER): Payer: Self-pay

## 2024-05-10 ENCOUNTER — Ambulatory Visit (HOSPITAL_BASED_OUTPATIENT_CLINIC_OR_DEPARTMENT_OTHER): Admit: 2024-05-10 | Discharge: 2024-05-10 | Disposition: A | Admitting: Radiology

## 2024-05-10 ENCOUNTER — Encounter (HOSPITAL_BASED_OUTPATIENT_CLINIC_OR_DEPARTMENT_OTHER): Payer: Self-pay | Admitting: Emergency Medicine

## 2024-05-10 ENCOUNTER — Ambulatory Visit (HOSPITAL_BASED_OUTPATIENT_CLINIC_OR_DEPARTMENT_OTHER)
Admission: EM | Admit: 2024-05-10 | Discharge: 2024-05-10 | Disposition: A | Discharge status: Home / Self Care | Attending: Family Medicine | Admitting: Family Medicine

## 2024-05-10 DIAGNOSIS — R42 Dizziness and giddiness: Secondary | ICD-10-CM

## 2024-05-10 DIAGNOSIS — M47812 Spondylosis without myelopathy or radiculopathy, cervical region: Secondary | ICD-10-CM | POA: Diagnosis not present

## 2024-05-10 DIAGNOSIS — M4802 Spinal stenosis, cervical region: Secondary | ICD-10-CM

## 2024-05-10 DIAGNOSIS — M542 Cervicalgia: Secondary | ICD-10-CM | POA: Diagnosis not present

## 2024-05-10 DIAGNOSIS — M5081 Other cervical disc disorders,  high cervical region: Secondary | ICD-10-CM | POA: Diagnosis not present

## 2024-05-10 DIAGNOSIS — R110 Nausea: Secondary | ICD-10-CM

## 2024-05-10 MED ORDER — TIZANIDINE HCL 4 MG PO TABS
4.0000 mg | ORAL_TABLET | Freq: Four times a day (QID) | ORAL | 0 refills | Status: DC | PRN
Start: 2024-05-10 — End: 2024-07-04
  Filled 2024-05-10: qty 30, 8d supply, fill #0

## 2024-05-10 MED ORDER — TRIAMCINOLONE ACETONIDE 40 MG/ML IJ SUSP
40.0000 mg | Freq: Once | INTRAMUSCULAR | Status: AC
Start: 2024-05-10 — End: 2024-05-10
  Administered 2024-05-10: 40 mg via INTRAMUSCULAR

## 2024-05-10 NOTE — ED Triage Notes (Signed)
 Pt thinks its her vertigo, she takes her medicine but feels like its not working she currently takes Zofran  and Meclizine. Pt feels dizzy headed, some nausea that comes and goes.

## 2024-05-10 NOTE — ED Provider Notes (Signed)
 [facility]    CSN: [id number] Arrival date & time: 05/10/24  1141      History   Chief Complaint Chief Complaint  Patient presents with   Dizziness    HPI Kaitlyn Rosales is a 68 y.o. female.   Patient is a 68 year old female presents today with neck pain and vertigo.  Symptoms have been constant over the past few days.  She has been unable to ambulate well due to the vertigo.  Feels that the room is spinning.  She has been taking her meclizine and Zofran  without any relief.  She is also had some right-sided neck pain.  No injuries or falls.  History of chronic neck problems and previous surgery.    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 Added automatically from request for surgery [phone]   Chronic respiratory failure with hypoxia (HCC) 01/09/2021   Gastroesophageal reflux disease without esophagitis 05/05/2017   History of MI (myocardial infarction) 03/07/2021   Formatting of this note might be different from the original. living in [place], never cathed (early 40s ~) patient reports MD said light heart attack   Mixed hyperlipidemia 10/29/2017   OSA (obstructive sleep apnea) 01/09/2021   Posttraumatic stress disorder 10/15/2016    Patient Active Problem List   Diagnosis Date Noted   Senile purpura 01/18/2024   Osteopenia 01/18/2024   Primary hypertension 01/18/2024   Panic attack 12/07/2023   Seasonal allergies 12/07/2023   Palpitations 12/07/2023   Allergic reaction 08/04/2021   COPD with acute exacerbation (HCC) 08/03/2021   Lumbar spondylosis 04/09/2021   OAB (overactive bladder) 04/09/2021   COPD (chronic obstructive pulmonary disease) (HCC) 03/07/2021   History of MI (myocardial infarction)  03/07/2021   Hypovitaminosis D 03/07/2021   Chronic respiratory failure with hypoxia (HCC) 01/09/2021   OSA (obstructive sleep apnea) 01/09/2021   Smoking greater than 40 pack years 01/09/2021   Intracranial aneurysm 08/22/2019   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   Other dysphagia 03/01/2019   S/P cervical spinal fusion 02/05/2019   Cervical spinal stenosis 10/08/2018   High risk medication use 09/22/2018   Mixed hyperlipidemia 10/29/2017   Tobacco abuse 09/08/2017   Cigarette nicotine dependence with nicotine-induced disorder 09/08/2017   Controlled type 2 diabetes mellitus with complication, without long-term current use of insulin (HCC) 07/31/2017   Gastroesophageal reflux disease without esophagitis 05/05/2017   Posttraumatic stress disorder 10/15/2016    Past Surgical History:  Procedure Laterality Date   APPENDECTOMY  1970   CERVICAL SPINE SURGERY  2020   CHOLECYSTECTOMY  1970   ELBOW SURGERY Right 1990   PARTIAL HYSTERECTOMY  1990    OB History     Gravida  3   Para  3   Term  2   Preterm  1   AB  0   Living  2      SAB      IAB      Ectopic      Multiple      Live Births               Home Medications    Prior to Admission medications   Medication Sig Start Date End Date Taking? Authorizing Provider  atorvastatin  (LIPITOR) 40 MG tablet Take 1 tablet (40 mg total) by mouth daily for cholesterol 06/03/23  Yes   meclizine (ANTIVERT) 25 MG tablet Take 25 mg by mouth every 6 (six) hours. Patient taking differently: Take 25 mg by mouth as needed. 04/25/22  Yes [provider]  tiZANidine (ZANAFLEX) 4 MG tablet Take 1 tablet (4 mg total) by mouth every 6 (six) hours as needed for muscle spasms. 05/10/24  Yes Maame Dack A, FNP  acetaminophen (TYLENOL) 325 MG tablet Take 325 mg by mouth as needed (prn).    [provider]  albuterol  (VENTOLIN  HFA) 108 (90 Base) MCG/ACT inhaler Inhale 2 puffs  into the lungs every 6 (six) hours as needed. 04/12/24   Rothfuss, Jacob T, [name]  Alum & Mag Hydroxide-Simeth (ANTACID LIQUID PO) Take by mouth as needed.    [provider]  Blood Glucose Monitoring Suppl (ACCU-CHEK GUIDE ME) w/Device KIT by Does not apply route.    [provider]  busPIRone  (BUSPAR ) 7.5 MG tablet Take 1 tablet (7.5 mg total) by mouth 2 (two) times daily. 12/07/23   Rothfuss, Jacob T, [name]  cetirizine  (ZYRTEC ) 10 MG tablet Take 1 tablet (10 mg total) by mouth daily. Patient taking differently: Take 10 mg by mouth as needed. 12/07/23     Cholecalciferol (VITAMIN D3) 250 MCG (10000 UT) TABS Take 10,000 Units by mouth daily. 01/20/19   [provider]  EPINEPHrine  (EPIPEN  2-PAK) 0.3 mg/0.3 mL IJ SOAJ injection Inject 0.3 mg into the muscle as needed for anaphylaxis. 03/18/24   Rothfuss, Jacob T, [name]  glucose blood (ACCU-CHEK GUIDE TEST) test strip Use to check glucose 3 times daily as instructed 01/18/24   Rothfuss, Jacob T, [name]  guaiFENesin (MUCINEX) 600 MG 12 hr tablet Take 1,200 mg by mouth as needed.    [provider]  ipratropium-albuterol  (DUONEB) 0.5-2.5 (3) MG/3ML SOLN Take 3 mLs by nebulization every 6 (six) hours. 02/23/24     mupirocin  ointment (BACTROBAN ) 2 % Apply 1 Application topically 2 (two) times daily. 03/18/24   Rothfuss, Jacob T, [name]  omeprazole (PRILOSEC) 20 MG capsule Take 20 mg by mouth daily.    [provider]  ondansetron  (ZOFRAN -ODT) 4 MG disintegrating tablet Take 4 mg by mouth every 8 (eight) hours as needed. 04/25/22   [provider]  OXYGEN  2 L by Does not apply route at bedtime.    [provider]  Tiotropium Bromide -Olodaterol (STIOLTO RESPIMAT ) 2.5-2.5 MCG/ACT AERS Inhale 2 puffs into the lungs daily. 12/04/23       Family History Family History  Problem Relation Age of Onset   Dementia Mother    Parkinson's disease Mother    Cirrhosis Father    Osteoarthritis Sister    Breast cancer  Sister    COPD Sister    COPD Brother     Social History Social History   Tobacco Use   Smoking status: Every Day    Current packs/day: 0.50    Average packs/day: 0.5 packs/day for 57.7 years (28.9 ttl pk-yrs)    Types: Cigarettes    Start date: 1968    Passive exposure: Current   Smokeless tobacco: Never  Vaping Use   Vaping status: Never Used  Substance Use Topics   Alcohol use: Not Currently   Drug use: Never     Allergies   Amoxicillin -pot clavulanate, Azithromycin , Doxycycline , Levofloxacin, Ceftriaxone , Acetaminophen, Clavulanic acid, Nitrofurantoin, Oxycodone-acetaminophen, Prednisone , Tape, Trimethoprim, and Sulfamethoxazole-trimethoprim   Review of Systems Review of Systems  Neurological:  Positive for dizziness.     Physical Exam Triage Vital Signs ED Triage Vitals  Encounter Vitals Group     BP 05/10/24 1151 115/75     Girls Systolic BP Percentile --      Girls Diastolic BP Percentile --      Boys Systolic BP Percentile --      Boys Diastolic BP Percentile --      Pulse Rate 05/10/24 1151 97     Resp 05/10/24 1151 20     Temp 05/10/24 1151 98.2 F (36.8 C)     Temp Source 05/10/24 1151 Oral     SpO2 05/10/24 1151 93 %     Weight --      Height --      Head Circumference --      Peak Flow --      Pain Score 05/10/24 1150 9     Pain Loc --      Pain Education --      Exclude from Growth Chart --    No data found.  Updated Vital Signs BP 115/75 (BP Location: Left Arm)   Pulse 97   Temp 98.2 F (36.8 C) (Oral)   Resp 20   LMP  (LMP Unknown)   SpO2 93%   Visual Acuity Right Eye Distance:   Left Eye Distance:   Bilateral Distance:    Right Eye Near:   Left Eye Near:    Bilateral Near:     Physical Exam Vitals and nursing note reviewed.  Constitutional:      General: She is not in acute distress.    Appearance: Normal appearance. She is not ill-appearing, toxic-appearing or diaphoretic.  HENT:     Right Ear: Tympanic membrane is  injected.     Left Ear: Tympanic membrane is injected.     Nose: Nose normal.     Mouth/Throat:     Pharynx: Oropharynx is clear.  Eyes:     Extraocular Movements: Extraocular movements intact.     Conjunctiva/sclera: Conjunctivae normal.     Pupils: Pupils are equal, round, and reactive to light.  Neck:   Cardiovascular:     Rate and Rhythm: Normal rate and regular rhythm.  Pulmonary:     Effort: Pulmonary effort is normal.     Breath sounds: Normal breath sounds.  Lymphadenopathy:     Cervical: No cervical adenopathy.  Skin:    General: Skin is warm and dry.  Neurological:     General: No focal deficit present.     Mental Status: She is alert.  Psychiatric:        Mood and Affect: Mood normal.        Behavior: Behavior normal.      UC Treatments / Results  Labs (all labs ordered are listed, but only abnormal results are displayed) Labs Reviewed - No data to display  EKG   Radiology DG Cervical Spine Complete Result Date: 05/10/2024 CLINICAL DATA:  Dizziness with nausea. Cervical spine surgery 8 years ago. Neck pain. EXAM: CERVICAL SPINE - COMPLETE 4+ VIEW COMPARISON:  05/26/2022 FINDINGS: Diffuse decreased bone mineralization. Vertebral body alignment is normal. Anterior fusion hardware intact and unchanged from C5-C7 mild to moderate spondylosis of the cervical spine to include uncovertebral joint spurring and facet arthropathy. Minimal disc space narrowing at the C3-4 level. Prevertebral soft tissues are normal. Atlantoaxial articulation is unremarkable. Moderate right-sided neural foraminal narrowing at the C3-4, C4-5 and C5-6 levels. Moderate left-sided neural foraminal narrowing  at the C4-5 and C5-6 levels. IMPRESSION: 1. No acute findings. 2. Mild to moderate spondylosis of the cervical spine with mild disc disease at the C3-4 level. 3. Anterior fusion hardware intact and unchanged from C5-C7. 4. Moderate bilateral neural foraminal narrowing as described.  Electronically Signed   By: Toribio Agreste M.D.   On: 05/10/2024 12:54    Procedures Procedures (including critical care time)  Medications Ordered in UC Medications  triamcinolone  acetonide (KENALOG -40) injection 40 mg (40 mg Intramuscular Given 05/10/24 1250)    Initial Impression / Assessment and Plan / UC Course  I have reviewed the triage vital signs and the nursing notes.  Pertinent labs & imaging results that were available during my care of the patient were reviewed by me and considered in my medical decision making (see chart for details).     Vertigo with neck pain- Pt has hx of vertigo but concerned due to this not resolving. She is taking meclizine and zofran  with no relief. She is also having left sided neck pain.  Cervical x ray performed with cervical hardware in place.  No acute findings. 2. Mild to moderate spondylosis of the cervical spine with mild disc disease at the C3-4 level. 3. Anterior fusion hardware intact and unchanged from C5-C7. 4. Moderate bilateral neural foraminal narrowing as described. Believe that she is having some muscular pain.  I am prescribing Zanaflex to help with this and given steroid injection here.  Hopefully the injection will also help with the vertigo/inner ear inflammation.  She can continue the meclizine and Zofran  as needed.  ER if worse.  Final Clinical Impressions(s) / UC Diagnoses   Final diagnoses:  Cervical spinal stenosis  Vertigo  Neck pain     Discharge Instructions      You have a lot of arthritis in your neck that is most likely causing your pain. We gave you a steroid injection here today. Hopefully this will help and also helo with the vertigo. I am also giving you a low dose muscle relaxer. Do not take this with the meclizine do to both being sedating. For worsening problems you will need to see your PCP or go to the ER.     ED Prescriptions     Medication Sig Dispense Auth. Provider   tiZANidine  (ZANAFLEX) 4 MG tablet Take 1 tablet (4 mg total) by mouth every 6 (six) hours as needed for muscle spasms. 30 tablet Adah Wilbert LABOR, FNP      PDMP not reviewed this encounter.   Adah Wilbert LABOR, FNP 05/10/24 [phone]

## 2024-05-10 NOTE — Discharge Instructions (Signed)
 You have a lot of arthritis in your neck that is most likely causing your pain. We gave you a steroid injection here today. Hopefully this will help and also helo with the vertigo. I am also giving you a low dose muscle relaxer. Do not take this with the meclizine do to both being sedating. For worsening problems you will need to see your PCP or go to the ER.

## 2024-05-16 ENCOUNTER — Other Ambulatory Visit (HOSPITAL_BASED_OUTPATIENT_CLINIC_OR_DEPARTMENT_OTHER): Payer: Self-pay

## 2024-05-16 ENCOUNTER — Other Ambulatory Visit: Payer: Self-pay

## 2024-05-18 ENCOUNTER — Other Ambulatory Visit: Payer: Self-pay | Admitting: Hematology and Oncology

## 2024-05-18 DIAGNOSIS — D72828 Other elevated white blood cell count: Secondary | ICD-10-CM

## 2024-05-19 ENCOUNTER — Inpatient Hospital Stay

## 2024-05-19 ENCOUNTER — Inpatient Hospital Stay: Admitting: Hematology and Oncology

## 2024-05-24 ENCOUNTER — Inpatient Hospital Stay: Admitting: Hematology and Oncology

## 2024-05-24 ENCOUNTER — Other Ambulatory Visit: Payer: Self-pay

## 2024-05-24 ENCOUNTER — Telehealth: Payer: Self-pay | Admitting: Hematology and Oncology

## 2024-05-24 ENCOUNTER — Inpatient Hospital Stay: Attending: Hematology and Oncology

## 2024-05-24 ENCOUNTER — Other Ambulatory Visit: Payer: Self-pay | Admitting: Hematology and Oncology

## 2024-05-24 VITALS — BP 120/69 | HR 84 | Temp 97.7°F | Resp 20 | Ht 63.5 in | Wt 182.7 lb

## 2024-05-24 DIAGNOSIS — D72829 Elevated white blood cell count, unspecified: Secondary | ICD-10-CM | POA: Insufficient documentation

## 2024-05-24 DIAGNOSIS — D72828 Other elevated white blood cell count: Secondary | ICD-10-CM

## 2024-05-24 DIAGNOSIS — K2210 Ulcer of esophagus without bleeding: Secondary | ICD-10-CM | POA: Insufficient documentation

## 2024-05-24 DIAGNOSIS — F17210 Nicotine dependence, cigarettes, uncomplicated: Secondary | ICD-10-CM | POA: Insufficient documentation

## 2024-05-24 DIAGNOSIS — Z803 Family history of malignant neoplasm of breast: Secondary | ICD-10-CM | POA: Insufficient documentation

## 2024-05-24 LAB — CBC WITH DIFFERENTIAL (CANCER CENTER ONLY)
Abs Immature Granulocytes: 0.04 K/uL (ref 0.00–0.07)
Basophils Absolute: 0.1 K/uL (ref 0.0–0.1)
Basophils Relative: 0 %
Eosinophils Absolute: 0.2 K/uL (ref 0.0–0.5)
Eosinophils Relative: 2 %
HCT: 41.9 % (ref 36.0–46.0)
Hemoglobin: 13.6 g/dL (ref 12.0–15.0)
Immature Granulocytes: 0 %
Lymphocytes Relative: 29 %
Lymphs Abs: 3.3 K/uL (ref 0.7–4.0)
MCH: 30.7 pg (ref 26.0–34.0)
MCHC: 32.5 g/dL (ref 30.0–36.0)
MCV: 94.6 fL (ref 80.0–100.0)
Monocytes Absolute: 0.9 K/uL (ref 0.1–1.0)
Monocytes Relative: 8 %
Neutro Abs: 7.1 K/uL (ref 1.7–7.7)
Neutrophils Relative %: 61 %
Platelet Count: 205 K/uL (ref 150–400)
RBC: 4.43 MIL/uL (ref 3.87–5.11)
RDW: 13.9 % (ref 11.5–15.5)
WBC Count: 11.6 K/uL — ABNORMAL HIGH (ref 4.0–10.5)
nRBC: 0 % (ref 0.0–0.2)

## 2024-05-24 LAB — CMP (CANCER CENTER ONLY)
ALT: 11 U/L (ref 0–44)
AST: 24 U/L (ref 15–41)
Albumin: 3.9 g/dL (ref 3.5–5.0)
Alkaline Phosphatase: 104 U/L (ref 38–126)
Anion gap: 10 (ref 5–15)
BUN: 10 mg/dL (ref 8–23)
CO2: 29 mmol/L (ref 22–32)
Calcium: 9.5 mg/dL (ref 8.9–10.3)
Chloride: 103 mmol/L (ref 98–111)
Creatinine: 0.98 mg/dL (ref 0.44–1.00)
GFR, Estimated: >60 mL/min (ref >60)
Glucose, Bld: 89 mg/dL (ref 70–99)
Potassium: 3.6 mmol/L (ref 3.5–5.1)
Sodium: 142 mmol/L (ref 135–145)
Total Bilirubin: 0.4 mg/dL (ref 0.0–1.2)
Total Protein: 6.4 g/dL — ABNORMAL LOW (ref 6.5–8.1)

## 2024-05-24 NOTE — Progress Notes (Signed)
 [facility] [address] [place],  [place]  [address] [phone]  Clinic Day:  05/24/2024   Referring physician: Iven Lang DASEN, [name]  Patient Care Team: Patient Care Team: Rothfuss, Jacob T, [name] as PCP - General (Physician Assistant)   REASON FOR CONSULTATION:  Neutrophilia  HISTORY OF PRESENT ILLNESS:   Kaitlyn Rosales is a 68 y.o. female with a history of neutrophilia who is referred in consultation by Lang Iven, [name] for assessment and management. She states she has not been told in the past that she has had any abnormalities with her blood work. She denies fever, chills, nausea or vomiting. She has noted shortness of breath due to COPD. This is being managed by her pulmonologist. She also notes esophageal ulcers in the past. She is up to date on her colonoscopy/ endoscopy. She is also up to date on her mammogram. She no longer needs Pap smears. Her pulmonologist has ordered a low dose CT for screening.   Medical history includes history of MI, obstructive sleep apnea, respiratory failure with hypoxia, aneurysm, cervical spinal stenosis, mixed hyperlipidemia, HTN, CKD III, GERD, PTSD  Surgical history includes cervical spine surgery, partial hysterectomy, elbow surgery, appendectomy, cholecystectomy  Family history includes Parkinson's, cirrhosis, osteoarthritis, breast cancer (sister and aunt), COPD   REVIEW OF SYSTEMS:   Review of Systems  Constitutional: Negative.   HENT:  Negative.    Eyes: Negative.   Respiratory:  Positive for cough, shortness of breath and wheezing.   Cardiovascular: Negative.   Gastrointestinal:  Positive for diarrhea.  Endocrine: Negative.   Genitourinary: Negative.    Musculoskeletal: Negative.   Skin: Negative.   Neurological: Negative.   Hematological: Negative.   Psychiatric/Behavioral: Negative.       VITALS:   Blood pressure 120/69, pulse 84, temperature 97.7 F (36.5 C), temperature source Oral,  resp. rate 20, height 5' 3.5 (1.613 m), weight 182 lb 11.2 oz (82.9 kg), SpO2 94%.  Wt Readings from Last 3 Encounters:  05/24/24 182 lb 11.2 oz (82.9 kg)  05/05/24 183 lb 6.4 oz (83.2 kg)  04/19/24 183 lb 1.6 oz (83.1 kg)    Body mass index is 31.86 kg/m.  Performance status (ECOG): 1 - Symptomatic but completely ambulatory  PHYSICAL EXAM:   Physical Exam Constitutional:      Appearance: Normal appearance. She is normal weight.  HENT:     Head: Normocephalic and atraumatic.     Nose: Nose normal.     Mouth/Throat:     Mouth: Mucous membranes are moist.  Cardiovascular:     Rate and Rhythm: Normal rate and regular rhythm.     Pulses: Normal pulses.     Heart sounds: Normal heart sounds.  Pulmonary:     Effort: Pulmonary effort is normal.     Breath sounds: Normal breath sounds.  Abdominal:     Palpations: Abdomen is soft.  Musculoskeletal:        General: Normal range of motion.     Cervical back: Normal range of motion.  Skin:    General: Skin is warm and dry.  Neurological:     General: No focal deficit present.     Mental Status: She is alert.  Psychiatric:        Mood and Affect: Mood normal.        Behavior: Behavior normal.        Thought Content: Thought content normal.        Judgment: Judgment normal.  LABS:      Latest Ref Rng & Units 05/24/2024   10:21 AM 05/05/2024    9:37 AM 04/19/2024   11:09 AM  CBC  WBC 4.0 - 10.5 K/uL 11.6  9.8  13.0   Hemoglobin 12.0 - 15.0 g/dL 86.3  86.7  85.6   Hematocrit 36.0 - 46.0 % 41.9  41.4  45.6   Platelets 150 - 400 K/uL 205  201  221       Latest Ref Rng & Units 05/24/2024   10:21 AM 05/05/2024    9:37 AM 04/19/2024   11:09 AM  CMP  Glucose 70 - 99 mg/dL 89  898  90   BUN 8 - 23 mg/dL 10  12  13    Creatinine 0.44 - 1.00 mg/dL 9.01  9.00  9.01   Sodium 135 - 145 mmol/L 142  140  137   Potassium 3.5 - 5.1 mmol/L 3.6  3.8  4.4   Chloride 98 - 111 mmol/L 103  102  97   CO2 22 - 32 mmol/L 29  27  23     Calcium  8.9 - 10.3 mg/dL 9.5  9.5  9.7   Total Protein 6.5 - 8.1 g/dL 6.4  6.7  6.4   Total Bilirubin 0.0 - 1.2 mg/dL 0.4  0.4  0.5   Alkaline Phos 38 - 126 U/L 104  108  113   AST 15 - 41 U/L 24  21  19    ALT 0 - 44 U/L 11  14  15       No results found for: CEA1, CEA / No results found for: CEA1, CEA No results found for: PSA1 No results found for: CAN199 No results found for: CAN125  No results found for: TOTALPROTELP, ALBUMINELP, A1GS, A2GS, BETS, BETA2SER, GAMS, MSPIKE, SPEI Lab Results  Component Value Date   TIBC 323 05/05/2024   FERRITIN 23 05/05/2024   IRONPCTSAT 19 05/05/2024   No results found for: LDH  STUDIES:   DG Cervical Spine Complete Result Date: 05/10/2024 CLINICAL DATA:  Dizziness with nausea. Cervical spine surgery 8 years ago. Neck pain. EXAM: CERVICAL SPINE - COMPLETE 4+ VIEW COMPARISON:  05/26/2022 FINDINGS: Diffuse decreased bone mineralization. Vertebral body alignment is normal. Anterior fusion hardware intact and unchanged from C5-C7 mild to moderate spondylosis of the cervical spine to include uncovertebral joint spurring and facet arthropathy. Minimal disc space narrowing at the C3-4 level. Prevertebral soft tissues are normal. Atlantoaxial articulation is unremarkable. Moderate right-sided neural foraminal narrowing at the C3-4, C4-5 and C5-6 levels. Moderate left-sided neural foraminal narrowing at the C4-5 and C5-6 levels. IMPRESSION: 1. No acute findings. 2. Mild to moderate spondylosis of the cervical spine with mild disc disease at the C3-4 level. 3. Anterior fusion hardware intact and unchanged from C5-C7. 4. Moderate bilateral neural foraminal narrowing as described. Electronically Signed   By: Toribio Agreste M.D.   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 Added automatically from request for surgery [id number]   Chronic respiratory failure with hypoxia (HCC) 01/09/2021   Gastroesophageal reflux disease without esophagitis 05/05/2017   History of MI (myocardial infarction) 03/07/2021   Formatting of this note might be different  from the original. living in [place], never cathed (early 5s ~) patient reports MD said light heart attack   Mixed hyperlipidemia 10/29/2017   OSA (obstructive sleep apnea) 01/09/2021   Posttraumatic stress disorder 10/15/2016    Past Surgical History:  Procedure Laterality Date   APPENDECTOMY  1970   CERVICAL SPINE SURGERY  2020   CHOLECYSTECTOMY  1970   ELBOW SURGERY Right 1990   PARTIAL HYSTERECTOMY  1990    Family History  Problem Relation Age of Onset   Dementia Mother    Parkinson's disease Mother    Cirrhosis Father    Osteoarthritis Sister    Breast cancer Sister    COPD Sister    COPD Brother     Social History:  reports that she has been smoking cigarettes. She started smoking about 57 years ago. She has a 28.9 pack-year smoking history. She has been exposed to tobacco smoke. She has never used smokeless tobacco. She reports that she does not currently use alcohol. She reports that she does not use drugs.The patient is alone  today.  Allergies:  Allergies  Allergen Reactions   Amoxicillin -Pot Clavulanate Hives    Took benadryl  and symptoms resolved in 2 days. No angioedema.   Azithromycin  Shortness Of Breath    Throat closing    Doxycycline  Anaphylaxis   Levofloxacin Itching and Rash   Ceftriaxone  Other (See Comments)    coughing, difficulty swallowing, rash - unclear if caused by ceftriaxone  - has tolerated cefazolin and cephalexin  previously   Acetaminophen    Clavulanic Acid    Nitrofurantoin Other (See Comments), Swelling and Hives    Required hospitalization    Oxycodone-Acetaminophen Other (See Comments)    Auditory hallucinations, unable to sleep   Prednisone  Swelling    CANNOT DO PILLS. Swelling in mouth.   Tape Dermatitis    Paper tape is fine   Trimethoprim    Sulfamethoxazole-Trimethoprim Rash    Current Medications: Current Outpatient Medications  Medication Sig Dispense Refill   acetaminophen (TYLENOL) 325 MG tablet Take 325 mg by mouth as needed (prn).     albuterol  (VENTOLIN  HFA) 108 (90 Base) MCG/ACT inhaler Inhale 2 puffs into the lungs every 6 (six) hours as needed. 6.7 g 1   Alum & Mag Hydroxide-Simeth (ANTACID LIQUID PO) Take by mouth as needed.     atorvastatin  (LIPITOR) 40 MG tablet Take 1 tablet (40 mg total) by mouth daily for cholesterol 90 tablet 3   Blood Glucose Monitoring Suppl (ACCU-CHEK GUIDE ME) w/Device KIT by Does not apply route.     busPIRone  (BUSPAR ) 7.5 MG tablet Take 1 tablet (7.5 mg total) by mouth 2 (two) times daily. 60 tablet 5   cetirizine  (ZYRTEC ) 10 MG tablet Take 1 tablet (10 mg total) by mouth daily. (Patient taking differently: Take 10 mg by mouth as needed.) 30 tablet 11   Cholecalciferol (VITAMIN D3) 250 MCG (10000 UT) TABS Take 10,000 Units by mouth daily.     EPINEPHrine  (EPIPEN  2-PAK) 0.3 mg/0.3 mL IJ SOAJ injection Inject 0.3 mg into the muscle as needed for anaphylaxis. 2 each 0   guaiFENesin (MUCINEX) 600 MG 12 hr tablet Take 1,200 mg by mouth as needed.     ipratropium-albuterol  (DUONEB) 0.5-2.5 (3) MG/3ML SOLN Take 3 mLs by nebulization every 6 (six) hours. 90 mL 3   meclizine (ANTIVERT) 25 MG tablet Take 25 mg by mouth every 6 (six) hours. (Patient taking differently: Take 25 mg by mouth as needed.)     mupirocin   ointment (BACTROBAN ) 2 % Apply 1 Application topically 2 (two) times daily. 22 g 0   omeprazole (PRILOSEC) 20 MG capsule Take 20 mg by mouth daily.     ondansetron  (ZOFRAN -ODT) 4 MG disintegrating tablet Take 4 mg by mouth every 8 (eight) hours as needed.     OXYGEN  2 L by Does not  apply route at bedtime.     Tiotropium Bromide -Olodaterol (STIOLTO RESPIMAT ) 2.5-2.5 MCG/ACT AERS Inhale 2 puffs into the lungs daily. 4 g 5   tiZANidine (ZANAFLEX) 4 MG tablet Take 1 tablet (4 mg total) by mouth every 6 (six) hours as needed for muscle spasms. 30 tablet 0   glucose blood (ACCU-CHEK GUIDE TEST) test strip Use to check glucose 3 times daily as instructed 100 each 6   No current facility-administered medications for this visit.     ASSESSMENT & PLAN:   Assessment:  Adena Sima is a 68 y.o. female with history of elevated white count. Today, CBC reveals slightly elevated white count at 11.6. We discussed numerous possibilities for elevated white count including inflammation, frequent infections and stress.  She does note inflammation from an esophageal ulcer. This could account for her elevated white count. She denies fever, chills or any signs/symptoms of infection.   Plan: 1.  Return to clinic in 3 months.    I discussed the assessment and treatment plan with the patient.  The patient was provided an opportunity to ask questions and all were answered.  The patient agreed with the plan and demonstrated an understanding of the instructions.    Thank you for the referral    20 minutes was spent in patient care.  This included time spent preparing to see the patient (e.g., review of tests), obtaining and/or reviewing separately obtained history, counseling and educating the patient/family/caregiver, ordering medications, tests, or procedures; documenting clinical information in the electronic or other health record, independently interpreting results and communicating results to the patient/family/caregiver as well as coordination of care.      Eleanor DELENA Bach, NP   Family Nurse Practitioner - Board Certified.  [facility] [phone]

## 2024-05-24 NOTE — Telephone Encounter (Signed)
 Patient has been scheduled for follow-up visit per 05/24/24 LOS.  Pt given an appt calendar with date and time.

## 2024-05-25 DIAGNOSIS — J449 Chronic obstructive pulmonary disease, unspecified: Secondary | ICD-10-CM | POA: Diagnosis not present

## 2024-05-26 ENCOUNTER — Telehealth: Payer: Self-pay

## 2024-05-26 NOTE — Telephone Encounter (Signed)
 Pt called to confirm if she was to take B12 qd or every other day.

## 2024-05-27 ENCOUNTER — Ambulatory Visit: Admitting: Podiatry

## 2024-06-01 ENCOUNTER — Ambulatory Visit (HOSPITAL_BASED_OUTPATIENT_CLINIC_OR_DEPARTMENT_OTHER)
Admission: RE | Admit: 2024-06-01 | Discharge: 2024-06-01 | Disposition: A | Discharge status: Home / Self Care | Source: Ambulatory Visit | Attending: Physician Assistant | Admitting: Physician Assistant

## 2024-06-01 ENCOUNTER — Ambulatory Visit (HOSPITAL_BASED_OUTPATIENT_CLINIC_OR_DEPARTMENT_OTHER)
Admit: 2024-06-01 | Discharge: 2024-06-01 | Disposition: A | Attending: Physician Assistant | Admitting: Physician Assistant

## 2024-06-01 ENCOUNTER — Encounter (HOSPITAL_BASED_OUTPATIENT_CLINIC_OR_DEPARTMENT_OTHER): Payer: Self-pay

## 2024-06-01 VITALS — BP 143/73 | HR 75 | Temp 98.1°F | Resp 18

## 2024-06-01 DIAGNOSIS — R051 Acute cough: Secondary | ICD-10-CM | POA: Diagnosis not present

## 2024-06-01 DIAGNOSIS — J3489 Other specified disorders of nose and nasal sinuses: Secondary | ICD-10-CM

## 2024-06-01 DIAGNOSIS — R42 Dizziness and giddiness: Secondary | ICD-10-CM

## 2024-06-01 DIAGNOSIS — J441 Chronic obstructive pulmonary disease with (acute) exacerbation: Secondary | ICD-10-CM

## 2024-06-01 DIAGNOSIS — R059 Cough, unspecified: Secondary | ICD-10-CM | POA: Diagnosis not present

## 2024-06-01 MED ORDER — MECLIZINE HCL 25 MG PO TABS: 25.0000 mg | ORAL_TABLET | Freq: Three times a day (TID) | ORAL | 0 refills | Status: AC | PRN

## 2024-06-01 MED ORDER — METHYLPREDNISOLONE ACETATE 80 MG/ML IJ SUSP
60.0000 mg | Freq: Once | INTRAMUSCULAR | Status: AC
  Administered 2024-06-01: 60 mg via INTRAMUSCULAR

## 2024-06-01 MED ORDER — IPRATROPIUM-ALBUTEROL 0.5-2.5 (3) MG/3ML IN SOLN
3.0000 mL | Freq: Once | RESPIRATORY_TRACT | Status: AC
  Administered 2024-06-01: 3 mL via RESPIRATORY_TRACT

## 2024-06-01 MED ORDER — CEFDINIR 300 MG PO CAPS: 300.0000 mg | ORAL_CAPSULE | Freq: Two times a day (BID) | ORAL | 0 refills | Status: DC

## 2024-06-01 NOTE — ED Provider Notes (Signed)
 [facility]    CSN: [id number] Arrival date & time: 06/01/24  1748      History   Chief Complaint Chief Complaint  Patient presents with   Dizziness    Ears hurt - Entered by patient   Ear Fullness    HPI Kaitlyn Rosales is a 68 y.o. female.   Patient presents today accompanied by her daughter who provides majority of history.  Reports a several day history of dizziness which she describes as a room spinning sensation, rhinorrhea, worsening cough, shortness of breath, bilateral ear fullness.  She denies any fever, chest pain, shortness of breath.  Denies any recent head injury or change in medication.  She does have a history of vertigo and has been taking her meclizine but this has been ineffective.  She reports that current symptoms are similar to previous episodes of this condition and symptoms are worse whenever she moves her head.  She does have a history of COPD and has been taking her maintenance medication.  She has at home nocturnal oxygen  but has not required increased use.  She has been using her albuterol  more frequently since her symptoms began.  She did take a home COVID test that was negative.  She has been treated with antibiotics several times recently including with azithromycin  despite this being listed on her chart as an allergy, cephalexin , clindamycin .    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 Added automatically from request for surgery [id number]   Chronic respiratory failure with hypoxia (HCC) 01/09/2021   Gastroesophageal reflux disease without esophagitis 05/05/2017   History of MI (myocardial infarction) 03/07/2021   Formatting of this note might be different from the original. living in [place], never  cathed (early 40s ~) patient reports MD said light heart attack   Mixed hyperlipidemia 10/29/2017   OSA (obstructive sleep apnea) 01/09/2021   Posttraumatic stress disorder 10/15/2016    Patient Active Problem List   Diagnosis Date Noted   Senile purpura 01/18/2024   Osteopenia 01/18/2024   Primary hypertension 01/18/2024   Panic attack 12/07/2023   Seasonal allergies 12/07/2023   Palpitations 12/07/2023   Allergic reaction 08/04/2021   COPD with acute exacerbation (HCC) 08/03/2021   Lumbar spondylosis 04/09/2021   OAB (overactive bladder) 04/09/2021   COPD (chronic obstructive pulmonary disease) (HCC) 03/07/2021   History of MI (myocardial infarction) 03/07/2021   Hypovitaminosis D 03/07/2021   Chronic respiratory failure with hypoxia (HCC) 01/09/2021   OSA (obstructive sleep apnea) 01/09/2021   Smoking greater than 40 pack years 01/09/2021   Intracranial aneurysm 08/22/2019   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   Other dysphagia 03/01/2019   S/P cervical spinal fusion 02/05/2019   Cervical spinal stenosis 10/08/2018   High risk medication use 09/22/2018   Mixed hyperlipidemia 10/29/2017   Tobacco abuse 09/08/2017   Cigarette nicotine dependence with nicotine-induced disorder 09/08/2017   Controlled type 2 diabetes mellitus with complication, without long-term current use of insulin (HCC) 07/31/2017   Gastroesophageal reflux disease without esophagitis 05/05/2017   Posttraumatic stress disorder 10/15/2016    Past Surgical History:  Procedure Laterality Date   APPENDECTOMY  1970   CERVICAL SPINE SURGERY  2020   CHOLECYSTECTOMY  1970   ELBOW SURGERY  Right 1990   PARTIAL HYSTERECTOMY  1990    OB History     Gravida  3   Para  3   Term  2   Preterm  1   AB  0   Living  2      SAB      IAB      Ectopic      Multiple      Live Births               Home Medications    Prior to Admission medications    Medication Sig Start Date End Date Taking? Authorizing Provider  atorvastatin  (LIPITOR) 40 MG tablet Take 1 tablet (40 mg total) by mouth daily for cholesterol 06/03/23  Yes   cefdinir  (OMNICEF ) 300 MG capsule Take 1 capsule (300 mg total) by mouth 2 (two) times daily. 06/01/24  Yes [name], [name]  meclizine (ANTIVERT) 25 MG tablet Take 1 tablet (25 mg total) by mouth 3 (three) times daily as needed for dizziness. 06/01/24  Yes [name], [name]  Tiotropium Bromide -Olodaterol (STIOLTO RESPIMAT ) 2.5-2.5 MCG/ACT AERS Inhale 2 puffs into the lungs daily. 12/04/23  Yes   acetaminophen (TYLENOL) 325 MG tablet Take 325 mg by mouth as needed (prn).    [provider]  albuterol  (VENTOLIN  HFA) 108 (90 Base) MCG/ACT inhaler Inhale 2 puffs into the lungs every 6 (six) hours as needed. 04/12/24   Rothfuss, Jacob T, [name]  Alum & Mag Hydroxide-Simeth (ANTACID LIQUID PO) Take by mouth as needed.    [provider]  Blood Glucose Monitoring Suppl (ACCU-CHEK GUIDE ME) w/Device KIT by Does not apply route.    [provider]  busPIRone  (BUSPAR ) 7.5 MG tablet Take 1 tablet (7.5 mg total) by mouth 2 (two) times daily. 12/07/23   Rothfuss, Jacob T, [name]  cetirizine  (ZYRTEC ) 10 MG tablet Take 1 tablet (10 mg total) by mouth daily. Patient taking differently: Take 10 mg by mouth as needed. 12/07/23     Cholecalciferol (VITAMIN D3) 250 MCG (10000 UT) TABS Take 10,000 Units by mouth daily. 01/20/19   [provider]  cyanocobalamin (VITAMIN B12) 1000 MCG tablet Take 1,000 mcg by mouth daily. 05/24/24   Parsons, Melissa A, NP  EPINEPHrine  (EPIPEN  2-PAK) 0.3 mg/0.3 mL IJ SOAJ injection Inject 0.3 mg into the muscle as needed for anaphylaxis. 03/18/24   Rothfuss, Jacob T, [name]  glucose blood (ACCU-CHEK GUIDE TEST) test strip Use to check glucose 3 times daily as instructed 01/18/24   Rothfuss, Jacob T, [name]  guaiFENesin (MUCINEX) 600 MG 12 hr tablet Take 1,200 mg by mouth as needed.     [provider]  ipratropium-albuterol  (DUONEB) 0.5-2.5 (3) MG/3ML SOLN Take 3 mLs by nebulization every 6 (six) hours. 02/23/24     mupirocin  ointment (BACTROBAN ) 2 % Apply 1 Application topically 2 (two) times daily. 03/18/24   Rothfuss, Jacob T, [name]  omeprazole (PRILOSEC) 20 MG capsule Take 20 mg by mouth daily.    [provider]  ondansetron  (ZOFRAN -ODT) 4 MG disintegrating tablet Take 4 mg by mouth every 8 (eight) hours as needed. 04/25/22   [provider]  OXYGEN  2 L by Does not apply route at bedtime.    [provider]  tiZANidine (ZANAFLEX) 4 MG tablet Take 1 tablet (4 mg total) by mouth every 6 (six) hours as needed for muscle spasms. 05/10/24   Adah Wilbert LABOR, FNP    Family History Family History  Problem  Relation Age of Onset   Dementia Mother    Parkinson's disease Mother    Cirrhosis Father    Osteoarthritis Sister    Breast cancer Sister    COPD Sister    COPD Brother     Social History Social History   Tobacco Use   Smoking status: Every Day    Current packs/day: 0.50    Average packs/day: 0.5 packs/day for 57.8 years (28.9 ttl pk-yrs)    Types: Cigarettes    Start date: 1968    Passive exposure: Current   Smokeless tobacco: Never  Vaping Use   Vaping status: Never Used  Substance Use Topics   Alcohol use: Not Currently   Drug use: Never     Allergies   Amoxicillin -pot clavulanate, Azithromycin , Doxycycline , Levofloxacin, Ceftriaxone , Acetaminophen, Clavulanic acid, Nitrofurantoin, Oxycodone-acetaminophen, Prednisone , Tape, Trimethoprim, and Sulfamethoxazole-trimethoprim   Review of Systems Review of Systems  Constitutional:  Positive for activity change. Negative for appetite change, fatigue and fever.  HENT:  Positive for congestion, postnasal drip and rhinorrhea. Negative for sinus pressure, sneezing and sore throat.   Respiratory:  Positive for cough and shortness of breath.   Cardiovascular:  Negative for chest  pain.  Gastrointestinal:  Negative for diarrhea, nausea and vomiting.  Neurological:  Positive for dizziness. Negative for syncope, speech difficulty, weakness, light-headedness and headaches.     Physical Exam Triage Vital Signs ED Triage Vitals  Encounter Vitals Group     BP 06/01/24 1856 (!) 143/73     Girls Systolic BP Percentile --      Girls Diastolic BP Percentile --      Boys Systolic BP Percentile --      Boys Diastolic BP Percentile --      Pulse Rate 06/01/24 1856 75     Resp 06/01/24 1856 18     Temp 06/01/24 1856 98.1 F (36.7 C)     Temp Source 06/01/24 1856 Oral     SpO2 06/01/24 1856 95 %     Weight --      Height --      Head Circumference --      Peak Flow --      Pain Score 06/01/24 1855 0     Pain Loc --      Pain Education --      Exclude from Growth Chart --    No data found.  Updated Vital Signs BP (!) 143/73 (BP Location: Left Arm)   Pulse 75   Temp 98.1 F (36.7 C) (Oral)   Resp 18   LMP  (LMP Unknown)   SpO2 95%   Visual Acuity Right Eye Distance:   Left Eye Distance:   Bilateral Distance:    Right Eye Near:   Left Eye Near:    Bilateral Near:     Physical Exam Vitals reviewed.  Constitutional:      General: She is awake. She is not in acute distress.    Appearance: Normal appearance. She is well-developed. She is not ill-appearing.     Comments: Very pleasant female appears stated age in no acute distress sitting comfortably in exam room  HENT:     Head: Normocephalic and atraumatic.     Right Ear: Ear canal and external ear normal. Tympanic membrane is retracted. Tympanic membrane is not erythematous or bulging.     Left Ear: Ear canal and external ear normal. Tympanic membrane is retracted. Tympanic membrane is not erythematous or bulging.     Nose:  Right Sinus: No maxillary sinus tenderness or frontal sinus tenderness.     Left Sinus: No maxillary sinus tenderness or frontal sinus tenderness.     Mouth/Throat:      Pharynx: Uvula midline. No oropharyngeal exudate or posterior oropharyngeal erythema.  Cardiovascular:     Rate and Rhythm: Normal rate and regular rhythm.     Heart sounds: Normal heart sounds, S1 normal and S2 normal. No murmur heard. Pulmonary:     Effort: Pulmonary effort is normal.     Breath sounds: Wheezing present. No rhonchi or rales.     Comments: Widespread wheezing improved but did not resolve following DuoNeb in clinic Lymphadenopathy:     Head:     Right side of head: No submental, submandibular or tonsillar adenopathy.     Left side of head: No submental, submandibular or tonsillar adenopathy.     Cervical: No cervical adenopathy.  Psychiatric:        Behavior: Behavior is cooperative.      UC Treatments / Results  Labs (all labs ordered are listed, but only abnormal results are displayed) Labs Reviewed - No data to display  EKG   Radiology DG Chest 2 View Result Date: 06/01/2024 CLINICAL DATA:  Rhinorrhea, cough, dizziness EXAM: CHEST - 2 VIEW COMPARISON:  03/18/2024 FINDINGS: Frontal and lateral views of the chest demonstrate an unremarkable cardiac silhouette. No acute airspace disease, effusion, or pneumothorax. No acute bony abnormalities. IMPRESSION: 1. No acute intrathoracic process. Electronically Signed   By: Ozell Daring M.D.   On: 06/01/2024 19:32    Procedures Procedures (including critical care time)  Medications Ordered in UC Medications  ipratropium-albuterol  (DUONEB) 0.5-2.5 (3) MG/3ML nebulizer solution 3 mL (has no administration in time range)  methylPREDNISolone  acetate (DEPO-MEDROL ) injection 60 mg (60 mg Intramuscular Given 06/01/24 1921)    Initial Impression / Assessment and Plan / UC Course  I have reviewed the triage vital signs and the nursing notes.  Pertinent labs & imaging results that were available during my care of the patient were reviewed by me and considered in my medical decision making (see chart for details).      Patient is well-appearing, afebrile, nontoxic, nontachycardic.  Vital signs and physical exam are reassuring.  She reports that dizziness is consistent with previous episodes of vertigo and she was given a refill of meclizine to be used as needed for dizziness symptoms.  We discussed that it may be worthwhile to follow-up with ENT to consider vestibular physical therapy; she is already established with an ENT and will call them to schedule an appointment.  Recommend she continue with nasal saline/sinus rinses as well as fluticasone nasal spray.  We discussed that I am also concerned that a lot of her symptoms are related to COPD exacerbation.  Chest x-ray was obtained that showed no acute cardiopulmonary disease.  She was given 40 mg of Depo-Medrol  in clinic with some improvement of symptoms.  She is unable to take oral steroids but has tolerated injectable steroids in the past so was not prescribed prednisone  taper.  She has multiple medication allergies but has taken cephalosporins including cefazolin and cephalexin  in the past.  We will start Omnicef  to cover for COPD exacerbation with no indication for dose adjustment based on metabolic panel from 05/24/2024 with creatinine of 0.98 and And creatinine clearance of 71 mg/min.  She does have ceftriaxone  listed as a potential medication allergy but in the comments of this it is unclear if she is truly allergic  to this medication or something else as she has taken cephalosporins in the past.  She does have an EpiPen  available and we discussed that if she has any concerning symptoms she is to use her EpiPen  and go immediately to the emergency room.  Recommended she follow-up with her primary care as soon as possible.  We discussed that if she has any worsening or changing symptoms she needs to go to the ER immediately to which she expressed understanding.  Strict return precautions given.  All questions were answered to her satisfaction.  Final Clinical  Impressions(s) / UC Diagnoses   Final diagnoses:  Acute cough  COPD exacerbation (HCC)  Vertigo     Discharge Instructions      I am glad that you are feeling better after the medication.  Continue using your nebulizer solution.  Start Omnicef  which is related to cephalexin  and cefazolin which you have taken in the past.  Take this twice a day for 7 days but if you have any signs of allergic reaction stop the medication and go to the emergency room as we discussed.  Please use the meclizine up to 3 times a day.  Make sure that you rest and drink plenty of fluid.  Follow-up with your primary care as soon as possible.  If anything worsens please go to the ER as we discussed.     ED Prescriptions     Medication Sig Dispense Auth. Provider   cefdinir  (OMNICEF ) 300 MG capsule Take 1 capsule (300 mg total) by mouth 2 (two) times daily. 14 capsule Tranice Laduke K, [name]   meclizine (ANTIVERT) 25 MG tablet Take 1 tablet (25 mg total) by mouth 3 (three) times daily as needed for dizziness. 30 tablet Amiri Riechers K, [name]      PDMP not reviewed this encounter.   Sherrell Rocky POUR, [name] 06/01/24 1955

## 2024-06-01 NOTE — Discharge Instructions (Signed)
 I am glad that you are feeling better after the medication.  Continue using your nebulizer solution.  Start Omnicef  which is related to cephalexin  and cefazolin which you have taken in the past.  Take this twice a day for 7 days but if you have any signs of allergic reaction stop the medication and go to the emergency room as we discussed.  Please use the meclizine up to 3 times a day.  Make sure that you rest and drink plenty of fluid.  Follow-up with your primary care as soon as possible.  If anything worsens please go to the ER as we discussed.

## 2024-06-01 NOTE — ED Triage Notes (Signed)
 Pt c/o bilateral ear fullness, head pressure, runny nose, and she is having dizziness she has been taking her vertigo medication but it hasn't helped her dizziness started this morning.

## 2024-06-03 ENCOUNTER — Ambulatory Visit (INDEPENDENT_AMBULATORY_CARE_PROVIDER_SITE_OTHER): Admitting: Podiatry

## 2024-06-03 DIAGNOSIS — M79674 Pain in right toe(s): Secondary | ICD-10-CM | POA: Diagnosis not present

## 2024-06-03 DIAGNOSIS — L84 Corns and callosities: Secondary | ICD-10-CM

## 2024-06-03 DIAGNOSIS — M79675 Pain in left toe(s): Secondary | ICD-10-CM

## 2024-06-03 DIAGNOSIS — B351 Tinea unguium: Secondary | ICD-10-CM

## 2024-06-03 DIAGNOSIS — E1151 Type 2 diabetes mellitus with diabetic peripheral angiopathy without gangrene: Secondary | ICD-10-CM | POA: Diagnosis not present

## 2024-06-03 NOTE — Progress Notes (Signed)
 Subjective:  Patient ID: Kaitlyn Rosales, female    DOB: 06/17/1956,  MRN: [id number]  Kaitlyn Rosales presents to clinic today for:  Chief Complaint  Patient presents with   [facility]    [facility] with callous/corn.  ABN signed A1c 6.1 in Sept No anti coag.    Patient notes nails are thick and elongated, causing pain in shoe gear when ambulating.  She has painful calluses and corns on the fifth toes, left submet 5, and the first MPJ bilateral.  PCP is Rothfuss, Lang DASEN, [name].  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 Added automatically from request for surgery [id number]   Chronic respiratory failure with hypoxia (HCC) 01/09/2021   Gastroesophageal reflux disease without esophagitis 05/05/2017   History of MI (myocardial infarction) 03/07/2021   Formatting of this note might be different from the original. living in [place], never cathed (early 40s ~) patient reports MD said light heart attack   Mixed hyperlipidemia 10/29/2017   OSA (obstructive sleep apnea) 01/09/2021   Posttraumatic stress disorder 10/15/2016   Allergies  Allergen Reactions   Amoxicillin -Pot Clavulanate Hives    Took benadryl  and symptoms resolved in 2 days. No angioedema.   Azithromycin  Shortness Of Breath    Throat closing    Doxycycline  Anaphylaxis   Levofloxacin Itching and Rash   Ceftriaxone  Other (See Comments)    coughing, difficulty swallowing, rash - unclear if caused by ceftriaxone  - has tolerated cefazolin and cephalexin  previously   Acetaminophen    Clavulanic Acid    Nitrofurantoin Other (See Comments), Swelling and Hives    Required hospitalization   Oxycodone-Acetaminophen Other (See Comments)    Auditory hallucinations, unable to sleep    Prednisone  Swelling    CANNOT DO PILLS. Swelling in mouth.   Tape Dermatitis    Paper tape is fine   Trimethoprim    Sulfamethoxazole-Trimethoprim Rash    Objective:  Kaitlyn Rosales is a pleasant 68 y.o. female in NAD. AAO x 3.  Vascular Examination: Patient has palpable DP pulse, absent PT pulse bilateral.  Delayed capillary refill bilateral toes.  Sparse digital hair bilateral.  Proximal to distal cooling WNL bilateral.    Dermatological Examination: Interspaces are clear with no open lesions noted bilateral.  Skin is shiny and atrophic bilateral.  Nails are 3-47mm thick, with yellowish/brown discoloration, subungual debris and distal onycholysis x10.  There is pain with compression of nails x10.  There are hyperkeratotic lesions noted bilateral distal fifth toe, left submet 5, and plantar medial first MPJ bilateral.     Latest Ref Rng & Units 04/19/2024   11:09 AM 01/18/2024   10:23 AM  Hemoglobin A1C  Hemoglobin-A1c 4.8 - 5.6 % 6.1  5.9    Patient qualifies for at-risk foot care because of diabetes with PVD.  Assessment/Plan: 1. Pain due to onychomycosis of toenails of both feet   2. Callus of foot   3. Type II diabetes mellitus with peripheral circulatory disorder (HCC)     Mycotic nails x10 were sharply debrided with sterile nail nippers and power debriding burr to decrease bulk and length.  Hyperkeratotic lesions x 5 were shaved with #312 blade.  3 of the lesions are proximal to the toe DIPJ's.  A Medicaid insurance waiver was signed for the callus shaving today since Medicaid does not cover shaving of any lesions.  This is her secondary insurance  Return in about 3 months (around 09/03/2024) for [facility].   Awanda CHARM Imperial, DPM, FACFAS [facility]     [address] [place], [place] [address]                Office [phone]  Fax [phone]

## 2024-06-06 ENCOUNTER — Other Ambulatory Visit (HOSPITAL_BASED_OUTPATIENT_CLINIC_OR_DEPARTMENT_OTHER): Payer: Self-pay

## 2024-06-07 ENCOUNTER — Other Ambulatory Visit (HOSPITAL_BASED_OUTPATIENT_CLINIC_OR_DEPARTMENT_OTHER): Payer: Self-pay

## 2024-06-08 ENCOUNTER — Other Ambulatory Visit (HOSPITAL_COMMUNITY): Payer: Self-pay

## 2024-06-09 ENCOUNTER — Other Ambulatory Visit (HOSPITAL_BASED_OUTPATIENT_CLINIC_OR_DEPARTMENT_OTHER): Payer: Self-pay

## 2024-06-16 ENCOUNTER — Other Ambulatory Visit (HOSPITAL_BASED_OUTPATIENT_CLINIC_OR_DEPARTMENT_OTHER): Payer: Self-pay

## 2024-06-16 ENCOUNTER — Other Ambulatory Visit: Payer: Self-pay

## 2024-06-16 MED ORDER — STIOLTO RESPIMAT 2.5-2.5 MCG/ACT IN AERS
2.0000 | INHALATION_SPRAY | Freq: Every day | RESPIRATORY_TRACT | 5 refills | Status: AC
  Filled 2024-06-16: qty 4, 30d supply, fill #0
  Filled 2024-07-13: qty 4, 30d supply, fill #1
  Filled 2024-08-15: qty 4, 30d supply, fill #2
  Filled 2024-09-01 – 2024-09-13 (×2): qty 4, 30d supply, fill #3

## 2024-06-17 ENCOUNTER — Other Ambulatory Visit: Payer: Self-pay | Admitting: Neurosurgery

## 2024-06-17 DIAGNOSIS — I671 Cerebral aneurysm, nonruptured: Secondary | ICD-10-CM

## 2024-06-20 ENCOUNTER — Other Ambulatory Visit (HOSPITAL_BASED_OUTPATIENT_CLINIC_OR_DEPARTMENT_OTHER): Payer: Self-pay | Admitting: Student

## 2024-06-20 ENCOUNTER — Other Ambulatory Visit (HOSPITAL_BASED_OUTPATIENT_CLINIC_OR_DEPARTMENT_OTHER): Payer: Self-pay

## 2024-06-20 DIAGNOSIS — E118 Type 2 diabetes mellitus with unspecified complications: Secondary | ICD-10-CM

## 2024-06-20 MED ORDER — ATORVASTATIN CALCIUM 40 MG PO TABS
40.0000 mg | ORAL_TABLET | Freq: Every day | ORAL | 3 refills | Status: AC
Start: 2024-06-20 — End: ?
  Filled 2024-06-20: qty 90, 90d supply, fill #0

## 2024-06-20 MED ORDER — DIAZEPAM 5 MG PO TABS
5.0000 mg | ORAL_TABLET | Freq: Once | ORAL | 0 refills | Status: DC | PRN
  Filled 2024-06-20: qty 2, 1d supply, fill #0

## 2024-06-20 MED ORDER — IPRATROPIUM-ALBUTEROL 0.5-2.5 (3) MG/3ML IN SOLN
3.0000 mL | Freq: Four times a day (QID) | RESPIRATORY_TRACT | 3 refills | Status: DC
  Filled 2024-06-20: qty 90, 8d supply, fill #0
  Filled 2024-06-27 – 2024-07-21 (×2): qty 90, 8d supply, fill #1
  Filled 2024-08-15: qty 90, 8d supply, fill #2
  Filled 2024-09-01: qty 90, 8d supply, fill #3

## 2024-06-20 MED ORDER — ACCU-CHEK SOFTCLIX LANCETS MISC
1.0000 | Freq: Three times a day (TID) | 5 refills | Status: AC
  Filled 2024-06-20: qty 100, 34d supply, fill #0
  Filled 2024-07-21: qty 100, 34d supply, fill #1
  Filled 2024-09-13: qty 100, 34d supply, fill #2

## 2024-06-21 ENCOUNTER — Other Ambulatory Visit (HOSPITAL_BASED_OUTPATIENT_CLINIC_OR_DEPARTMENT_OTHER): Payer: Self-pay

## 2024-06-23 ENCOUNTER — Ambulatory Visit (HOSPITAL_BASED_OUTPATIENT_CLINIC_OR_DEPARTMENT_OTHER)
Admission: EM | Admit: 2024-06-23 | Discharge: 2024-06-23 | Disposition: A | Discharge status: Home / Self Care | Attending: Family Medicine | Admitting: Family Medicine

## 2024-06-23 ENCOUNTER — Ambulatory Visit (HOSPITAL_BASED_OUTPATIENT_CLINIC_OR_DEPARTMENT_OTHER)

## 2024-06-23 ENCOUNTER — Encounter (HOSPITAL_BASED_OUTPATIENT_CLINIC_OR_DEPARTMENT_OTHER): Payer: Self-pay

## 2024-06-23 DIAGNOSIS — H5711 Ocular pain, right eye: Secondary | ICD-10-CM | POA: Diagnosis not present

## 2024-06-23 DIAGNOSIS — J069 Acute upper respiratory infection, unspecified: Secondary | ICD-10-CM

## 2024-06-23 DIAGNOSIS — H109 Unspecified conjunctivitis: Secondary | ICD-10-CM

## 2024-06-23 MED ORDER — ERYTHROMYCIN 5 MG/GM OP OINT: TOPICAL_OINTMENT | OPHTHALMIC | 0 refills | Status: DC

## 2024-06-23 MED ORDER — CETIRIZINE HCL 10 MG PO TABS: 10.0000 mg | ORAL_TABLET | Freq: Every day | ORAL | 0 refills | Status: AC

## 2024-06-23 NOTE — Discharge Instructions (Addendum)
 Conjunctivitis of right eye and right eye pain: Erythromycin ophthalmic ointment, fourth of an inch ribbon into the right eye twice daily for 5-7 days.  Stop if any allergic reaction (rash, shortness of breath, anaphylaxis).  Patient does have an EpiPen  and does not live alone.  Viral upper respiratory infection with cough: Cetirizine , 10 mg, once daily for nasal congestion.  Follow-up with primary care if symptoms do not improve, worsen or new symptoms occur.

## 2024-06-23 NOTE — ED Provider Notes (Signed)
 [facility]    CSN: [id number] Arrival date & time: 06/23/24  1734      History   Chief Complaint Chief Complaint  Patient presents with   Eye Pain   Nasal Congestion    HPI Kaitlyn Rosales is a 68 y.o. female.   68 year old female whose had itchy watery painful eyes for 2 to 3 days since approximately 05/20/2024 or 05/21/2024.  She has also had a runny nose cough and some chills.  Mucinex did not help her nasal congestion.  She denies fever, wheezing, shortness of breath, nausea, vomiting, constipation, diarrhea.   Eye Pain Pertinent negatives include no chest pain, no abdominal pain and no shortness of breath.    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 Added automatically from request for surgery [phone]   Chronic respiratory failure with hypoxia (HCC) 01/09/2021   Gastroesophageal reflux disease without esophagitis 05/05/2017   History of MI (myocardial infarction) 03/07/2021   Formatting of this note might be different from the original. living in [place], never cathed (early 40s ~) patient reports MD said light heart attack   Mixed hyperlipidemia 10/29/2017   OSA (obstructive sleep apnea) 01/09/2021   Posttraumatic stress disorder 10/15/2016    Patient Active Problem List   Diagnosis Date Noted   Senile purpura 01/18/2024   Osteopenia 01/18/2024   Primary hypertension 01/18/2024   Panic attack 12/07/2023   Seasonal allergies 12/07/2023   Palpitations 12/07/2023   Allergic reaction 08/04/2021   COPD with acute exacerbation (HCC) 08/03/2021   Lumbar spondylosis 04/09/2021   OAB (overactive bladder) 04/09/2021   COPD (chronic obstructive pulmonary disease) (HCC) 03/07/2021   History of MI (myocardial  infarction) 03/07/2021   Hypovitaminosis D 03/07/2021   Chronic respiratory failure with hypoxia (HCC) 01/09/2021   OSA (obstructive sleep apnea) 01/09/2021   Smoking greater than 40 pack years 01/09/2021   Intracranial aneurysm 08/22/2019   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   Other dysphagia 03/01/2019   S/P cervical spinal fusion 02/05/2019   Cervical spinal stenosis 10/08/2018   High risk medication use 09/22/2018   Mixed hyperlipidemia 10/29/2017   Tobacco abuse 09/08/2017   Cigarette nicotine dependence with nicotine-induced disorder 09/08/2017   Controlled type 2 diabetes mellitus with complication, without long-term current use of insulin (HCC) 07/31/2017   Gastroesophageal reflux disease without esophagitis 05/05/2017   Posttraumatic stress disorder 10/15/2016    Past Surgical History:  Procedure Laterality Date   APPENDECTOMY  1970   CERVICAL SPINE SURGERY  2020   CHOLECYSTECTOMY  1970   ELBOW SURGERY Right 1990   PARTIAL HYSTERECTOMY  1990    OB History     Gravida  3   Para  3   Term  2   Preterm  1   AB  0   Living  2      SAB      IAB      Ectopic      Multiple      Live Births               Home Medications    Prior to Admission medications   Medication Sig Start Date End Date Taking? Authorizing Provider  cetirizine  (ZYRTEC ) 10 MG tablet  Take 1 tablet (10 mg total) by mouth daily. 06/23/24  Yes Ival Domino, FNP  Accu-Chek Softclix Lancets lancets Use as directed to check blood sugar three times daily. 06/20/24   Rothfuss, Jacob T, [name]  acetaminophen (TYLENOL) 325 MG tablet Take 325 mg by mouth as needed (prn).    [provider]  albuterol  (VENTOLIN  HFA) 108 (90 Base) MCG/ACT inhaler Inhale 2 puffs into the lungs every 6 (six) hours as needed. 04/12/24   Rothfuss, Jacob T, [name]  Alum & Mag Hydroxide-Simeth (ANTACID LIQUID PO) Take by mouth as needed.    [provider]   atorvastatin  (LIPITOR) 40 MG tablet Take 1 tablet (40 mg total) by mouth daily for cholesterol 06/20/24   Rothfuss, Jacob T, [name]  Blood Glucose Monitoring Suppl (ACCU-CHEK GUIDE ME) w/Device KIT by Does not apply route.    [provider]  busPIRone  (BUSPAR ) 7.5 MG tablet Take 1 tablet (7.5 mg total) by mouth 2 (two) times daily. 12/07/23   Rothfuss, Jacob T, [name]  cefdinir  (OMNICEF ) 300 MG capsule Take 1 capsule (300 mg total) by mouth 2 (two) times daily. 06/01/24   Raspet, Erin K, [name]  Cholecalciferol (VITAMIN D3) 250 MCG (10000 UT) TABS Take 10,000 Units by mouth daily. 01/20/19   [provider]  cyanocobalamin (VITAMIN B12) 1000 MCG tablet Take 1,000 mcg by mouth daily. 05/24/24   Harl Setter A, NP  diazepam  (VALIUM ) 5 MG tablet Take 1 tablet (5 mg total) by mouth 45 minutes prior to MRI. Repeat as needed 20 minutes prior to MRI. 06/20/24     EPINEPHrine  (EPIPEN  2-PAK) 0.3 mg/0.3 mL IJ SOAJ injection Inject 0.3 mg into the muscle as needed for anaphylaxis. 03/18/24   Rothfuss, Jacob T, [name]  erythromycin ophthalmic ointment Place a 1/4 inch ribbon of ointment into the lower eyelid twice daily x 5-7 days 06/23/24   Ival Domino, FNP  glucose blood (ACCU-CHEK GUIDE TEST) test strip Use to check glucose 3 times daily as instructed 01/18/24   Rothfuss, Jacob T, [name]  guaiFENesin (MUCINEX) 600 MG 12 hr tablet Take 1,200 mg by mouth as needed.    [provider]  ipratropium-albuterol  (DUONEB) 0.5-2.5 (3) MG/3ML SOLN Take 3 mLs by nebulization every 6 (six) hours. 06/20/24   Rothfuss, Jacob T, [name]  meclizine (ANTIVERT) 25 MG tablet Take 1 tablet (25 mg total) by mouth 3 (three) times daily as needed for dizziness. 06/01/24   Raspet, Erin K, [name]  mupirocin  ointment (BACTROBAN ) 2 % Apply 1 Application topically 2 (two) times daily. 03/18/24   Rothfuss, Jacob T, [name]  omeprazole (PRILOSEC) 20 MG capsule Take 20 mg by mouth daily.    [provider]  ondansetron   (ZOFRAN -ODT) 4 MG disintegrating tablet Take 4 mg by mouth every 8 (eight) hours as needed. 04/25/22   [provider]  OXYGEN  2 L by Does not apply route at bedtime.    [provider]  Tiotropium Bromide -Olodaterol (STIOLTO RESPIMAT ) 2.5-2.5 MCG/ACT AERS Inhale 2 puffs into the lungs daily. 06/16/24     tiZANidine (ZANAFLEX) 4 MG tablet Take 1 tablet (4 mg total) by mouth every 6 (six) hours as needed for muscle spasms. 05/10/24   Adah Wilbert LABOR, FNP    Family History Family History  Problem Relation Age of Onset   Dementia Mother    Parkinson's disease Mother    Cirrhosis Father    Osteoarthritis Sister    Breast cancer Sister    COPD Sister    COPD  Brother     Social History Social History   Tobacco Use   Smoking status: Every Day    Current packs/day: 0.50    Average packs/day: 0.5 packs/day for 57.9 years (28.9 ttl pk-yrs)    Types: Cigarettes    Start date: 1968    Passive exposure: Current   Smokeless tobacco: Never  Vaping Use   Vaping status: Never Used  Substance Use Topics   Alcohol use: Not Currently   Drug use: Never     Allergies   Amoxicillin -pot clavulanate, Azithromycin , Doxycycline , Levofloxacin, Ceftriaxone , Acetaminophen, Clavulanic acid, Nitrofurantoin, Oxycodone-acetaminophen, Prednisone , Tape, Trimethoprim, and Sulfamethoxazole-trimethoprim   Review of Systems Review of Systems  Constitutional:  Negative for chills and fever.  HENT:  Positive for congestion, postnasal drip and rhinorrhea. Negative for ear pain and sore throat.   Eyes:  Positive for pain and redness. Negative for visual disturbance.  Respiratory:  Positive for cough. Negative for shortness of breath.   Cardiovascular:  Negative for chest pain and palpitations.  Gastrointestinal:  Negative for abdominal pain, constipation, diarrhea, nausea and vomiting.  Genitourinary:  Negative for dysuria and hematuria.  Musculoskeletal:  Negative for arthralgias and back pain.   Skin:  Negative for color change and rash.  Neurological:  Negative for seizures and syncope.  All other systems reviewed and are negative.    Physical Exam Triage Vital Signs ED Triage Vitals  Encounter Vitals Group     BP 06/23/24 1804 (!) 144/82     Girls Systolic BP Percentile --      Girls Diastolic BP Percentile --      Boys Systolic BP Percentile --      Boys Diastolic BP Percentile --      Pulse Rate 06/23/24 1804 89     Resp 06/23/24 1804 20     Temp 06/23/24 1804 98 F (36.7 C)     Temp Source 06/23/24 1804 Oral     SpO2 06/23/24 1804 94 %     Weight --      Height --      Head Circumference --      Peak Flow --      Pain Score 06/23/24 1801 7     Pain Loc --      Pain Education --      Exclude from Growth Chart --    No data found.  Updated Vital Signs BP (!) 144/82 (BP Location: Right Arm)   Pulse 89   Temp 98 F (36.7 C) (Oral)   Resp 20   LMP  (LMP Unknown)   SpO2 94%   Visual Acuity Right Eye Distance:   Left Eye Distance:   Bilateral Distance:    Right Eye Near:   Left Eye Near:    Bilateral Near:     Physical Exam Vitals and nursing note reviewed.  Constitutional:      General: She is not in acute distress.    Appearance: She is well-developed. She is not ill-appearing, toxic-appearing or diaphoretic.  HENT:     Head: Normocephalic and atraumatic.     Right Ear: Hearing, tympanic membrane, ear canal and external ear normal.     Left Ear: Hearing, tympanic membrane, ear canal and external ear normal.     Nose: Congestion and rhinorrhea present. Rhinorrhea is clear.     Right Sinus: No maxillary sinus tenderness or frontal sinus tenderness.     Left Sinus: No maxillary sinus tenderness or frontal sinus tenderness.  Mouth/Throat:     Lips: Pink.     Mouth: Mucous membranes are moist.     Pharynx: Uvula midline. No oropharyngeal exudate or posterior oropharyngeal erythema.     Tonsils: No tonsillar exudate.  Eyes:     General:         Right eye: Discharge (Watery discharge) present. No foreign body or hordeolum.        Left eye: No foreign body, discharge or hordeolum.     Extraocular Movements:     Right eye: Normal extraocular motion and no nystagmus.     Left eye: Normal extraocular motion and no nystagmus.     Conjunctiva/sclera:     Right eye: Right conjunctiva is injected. Exudate (Watery discharge) present. No chemosis or hemorrhage.    Left eye: Left conjunctiva is not injected. No chemosis, exudate or hemorrhage.    Pupils: Pupils are equal, round, and reactive to light.     Right eye: Pupil is round, reactive and not sluggish. No corneal abrasion or fluorescein uptake.  Cardiovascular:     Rate and Rhythm: Normal rate and regular rhythm.     Heart sounds: S1 normal and S2 normal. No murmur heard. Pulmonary:     Effort: Pulmonary effort is normal. No respiratory distress.     Breath sounds: Normal breath sounds. No decreased breath sounds, wheezing, rhonchi or rales.  Abdominal:     General: Bowel sounds are normal.     Palpations: Abdomen is soft.     Tenderness: There is no abdominal tenderness.  Musculoskeletal:        General: No swelling.     Cervical back: Neck supple.  Lymphadenopathy:     Head:     Right side of head: No submental, submandibular, tonsillar, preauricular or posterior auricular adenopathy.     Left side of head: No submental, submandibular, tonsillar, preauricular or posterior auricular adenopathy.     Cervical: No cervical adenopathy.     Right cervical: No superficial cervical adenopathy.    Left cervical: No superficial cervical adenopathy.  Skin:    General: Skin is warm and dry.     Capillary Refill: Capillary refill takes less than 2 seconds.     Findings: No rash.  Neurological:     Mental Status: She is alert and oriented to person, place, and time.  Psychiatric:        Mood and Affect: Mood normal.      UC Treatments / Results  Labs (all labs ordered are  listed, but only abnormal results are displayed) Labs Reviewed - No data to display  EKG   Radiology No results found.  Procedures Procedures (including critical care time)  Medications Ordered in UC Medications - No data to display  Initial Impression / Assessment and Plan / UC Course  I have reviewed the triage vital signs and the nursing notes.  Pertinent labs & imaging results that were available during my care of the patient were reviewed by me and considered in my medical decision making (see chart for details).  Plan of Care: Conjunctivitis of right eye and right eye pain: Erythromycin ophthalmic ointment, fourth of an inch ribbon into the right eye twice daily for 5-7 days.  Stop if any allergic reaction (rash, shortness of breath, anaphylaxis).  Patient does have an EpiPen  and does not live alone.  Viral upper respiratory infection with cough: Cetirizine , 10 mg, once daily for nasal congestion.  Follow-up with primary care if symptoms do not improve,  worsen or new symptoms occur.  I reviewed the plan of care with the patient and/or the patient's guardian.  The patient and/or guardian had time to ask questions and acknowledged that the questions were answered.  I provided instruction on symptoms or reasons to return here or to go to an ER, if symptoms/condition did not improve, worsened or if new symptoms occurred.  Final Clinical Impressions(s) / UC Diagnoses   Final diagnoses:  Viral URI with cough  Conjunctivitis of right eye, unspecified conjunctivitis type  Acute right eye pain     Discharge Instructions      Conjunctivitis of right eye and right eye pain: Erythromycin ophthalmic ointment, fourth of an inch ribbon into the right eye twice daily for 5-7 days.  Stop if any allergic reaction (rash, shortness of breath, anaphylaxis).  Patient does have an EpiPen  and does not live alone.  Viral upper respiratory infection with cough: Cetirizine , 10 mg, once daily for  nasal congestion.  Follow-up with primary care if symptoms do not improve, worsen or new symptoms occur.     ED Prescriptions     Medication Sig Dispense Auth. Provider   erythromycin ophthalmic ointment  (Status: Discontinued) Place a 1/4 inch ribbon of ointment into the lower eyelid twice daily x 5-7 days 3.5 g Ival Domino, FNP   cetirizine  (ZYRTEC ) 10 MG tablet Take 1 tablet (10 mg total) by mouth daily. 30 tablet Nollie Shiflett, FNP   erythromycin ophthalmic ointment Place a 1/4 inch ribbon of ointment into the lower eyelid twice daily x 5-7 days 3.5 g Ival Domino, FNP      PDMP not reviewed this encounter.   Ival Domino, FNP 06/23/24 [phone]

## 2024-06-23 NOTE — ED Triage Notes (Signed)
 Pt states she has been having itchy/watery/painful eyes, runny nose, cough-COPD, and chills. Pt has taken mucinex with no relief.

## 2024-06-27 ENCOUNTER — Other Ambulatory Visit (HOSPITAL_BASED_OUTPATIENT_CLINIC_OR_DEPARTMENT_OTHER): Payer: Self-pay

## 2024-06-29 ENCOUNTER — Other Ambulatory Visit (HOSPITAL_BASED_OUTPATIENT_CLINIC_OR_DEPARTMENT_OTHER): Payer: Self-pay

## 2024-06-29 MED ORDER — LIDOCAINE 5 % EX PTCH
MEDICATED_PATCH | CUTANEOUS | 0 refills | Status: DC
  Filled 2024-06-29: qty 30, 30d supply, fill #0

## 2024-06-29 MED ORDER — DICLOFENAC SODIUM 1 % EX GEL
CUTANEOUS | 0 refills | Status: DC
  Filled 2024-06-29: qty 100, 33d supply, fill #0

## 2024-07-01 ENCOUNTER — Other Ambulatory Visit (HOSPITAL_BASED_OUTPATIENT_CLINIC_OR_DEPARTMENT_OTHER): Payer: Self-pay

## 2024-07-04 ENCOUNTER — Other Ambulatory Visit (HOSPITAL_BASED_OUTPATIENT_CLINIC_OR_DEPARTMENT_OTHER): Payer: Self-pay | Admitting: Student

## 2024-07-04 ENCOUNTER — Other Ambulatory Visit (HOSPITAL_BASED_OUTPATIENT_CLINIC_OR_DEPARTMENT_OTHER): Payer: Self-pay

## 2024-07-04 MED ORDER — TIZANIDINE HCL 4 MG PO TABS
4.0000 mg | ORAL_TABLET | Freq: Four times a day (QID) | ORAL | 0 refills | Status: AC | PRN
  Filled 2024-07-04: qty 30, 8d supply, fill #0

## 2024-07-06 ENCOUNTER — Other Ambulatory Visit (HOSPITAL_BASED_OUTPATIENT_CLINIC_OR_DEPARTMENT_OTHER): Payer: Self-pay

## 2024-07-09 ENCOUNTER — Other Ambulatory Visit

## 2024-07-13 ENCOUNTER — Other Ambulatory Visit (HOSPITAL_BASED_OUTPATIENT_CLINIC_OR_DEPARTMENT_OTHER): Payer: Self-pay

## 2024-07-14 ENCOUNTER — Other Ambulatory Visit: Payer: Self-pay

## 2024-07-14 ENCOUNTER — Other Ambulatory Visit (HOSPITAL_BASED_OUTPATIENT_CLINIC_OR_DEPARTMENT_OTHER): Payer: Self-pay

## 2024-07-14 ENCOUNTER — Other Ambulatory Visit (HOSPITAL_COMMUNITY): Payer: Self-pay

## 2024-07-19 ENCOUNTER — Encounter: Payer: Self-pay | Admitting: *Deleted

## 2024-07-19 ENCOUNTER — Encounter (HOSPITAL_BASED_OUTPATIENT_CLINIC_OR_DEPARTMENT_OTHER): Payer: Self-pay | Admitting: Student

## 2024-07-19 ENCOUNTER — Ambulatory Visit (INDEPENDENT_AMBULATORY_CARE_PROVIDER_SITE_OTHER): Admitting: Student

## 2024-07-19 VITALS — BP 131/78 | HR 91 | Temp 98.3°F | Resp 16 | Ht 63.5 in | Wt 184.1 lb

## 2024-07-19 DIAGNOSIS — D692 Other nonthrombocytopenic purpura: Secondary | ICD-10-CM

## 2024-07-19 DIAGNOSIS — J449 Chronic obstructive pulmonary disease, unspecified: Secondary | ICD-10-CM

## 2024-07-19 DIAGNOSIS — I10 Essential (primary) hypertension: Secondary | ICD-10-CM

## 2024-07-19 DIAGNOSIS — E118 Type 2 diabetes mellitus with unspecified complications: Secondary | ICD-10-CM

## 2024-07-19 DIAGNOSIS — E782 Mixed hyperlipidemia: Secondary | ICD-10-CM

## 2024-07-19 DIAGNOSIS — M1611 Unilateral primary osteoarthritis, right hip: Secondary | ICD-10-CM | POA: Insufficient documentation

## 2024-07-19 LAB — LIPID PANEL
Chol/HDL Ratio: 2.6 ratio (ref 0.0–4.4)
Cholesterol, Total: 153 mg/dL (ref 100–199)
HDL: 58 mg/dL (ref >39)
LDL Chol Calc (NIH): 69 mg/dL (ref 0–99)
Triglycerides: 152 mg/dL — ABNORMAL HIGH (ref 0–149)
VLDL Cholesterol Cal: 26 mg/dL (ref 5–40)

## 2024-07-19 LAB — HEMOGLOBIN A1C
Est. average glucose Bld gHb Est-mCnc: 128 mg/dL
Hgb A1c MFr Bld: 6.1 % — ABNORMAL HIGH (ref 4.8–5.6)

## 2024-07-19 NOTE — Patient Instructions (Addendum)
 It was nice to see you today!  Please call [phone]-Quit-Now  The current recommendation for osteopenia (mildly thin bones) treatment includes:  #1 calcium -total of 1200 mg of calcium  daily.  If you eat a very calcium  rich diet you may be able to obtain that without a supplement.  If not, then I recommend calcium  500 mg twice a day.  There are several products over-the-counter such as Caltrate D and Viactiv chews which are great options that contain calcium  and vitamin D. #2 vitamin D-recommend 800 international units daily. #3 exercise-recommend 30 minutes of weightbearing exercise 3 days a week.  Resistance training ,such as doing bands and light weights, can be particularly helpful.  If you have any problems before your next visit feel free to message me via MyChart (minor issues or questions) or call the office, otherwise you may reach out to schedule an office visit.  Thank you! Shravan Salahuddin, [name]

## 2024-07-19 NOTE — Progress Notes (Unsigned)
 Established Patient Office Visit  Subjective   Patient ID: Kaitlyn Rosales, female    DOB: 11/18/1955  Age: 68 y.o. MRN: [id number]  Chief Complaint  Patient presents with   Medical Management of Chronic Issues    Follow up. Was at ER on 06/28/2024 due to right hip pain. Was given a steroid shot and one dose pain pill. Had a CT done. Has a lot of chores she cannot do. Daughter has been helping her. Was referred to orthopedic. Has appt 08/08/2024.    Nicotine Dependence    Would like to start chantix.     HPI  Discussed the use of AI scribe software for clinical note transcription with the patient, who gave verbal consent to proceed.  History of Present Illness   Kaitlyn Rosales is a 68 year old female with COPD and hip pain who presents for a follow-up visit.  She has COPD and continues to use Stiolto and albuterol  inhalers, as well as Duo Nebs at home, with no issues reported. A recent deep CT scan of her lungs showed no changes since 2024. No recent chest pain or shortness of breath.  She has a history of hip pain and recently experienced severe pain localized to her buttocks, not radiating down her leg. She received a steroid injection and pain medication in the ER, which provided temporary relief. She uses a walker when necessary and takes muscle relaxers as needed for pain management.  She has gastroesophageal reflux disease (GERD) and uses Prilosec 20 mg as needed, along with an antacid liquid.  She is on Lipitor 40 mg for cholesterol management, with her last cholesterol panel in September showing good results.  She has a history of anxiety and manages it without frequent use of medications like hydroxyzine and diazepam . She is trying to control her anxiety independently and has reduced her cigarette consumption to a pack lasting two days.  She has a history of low vitamin B12 levels, previously managed with oral supplements, which she discontinued due to cramps. She has a  history of anemia since childhood and reports foot cramps and swelling. She is hesitant about B12 injections.  She reports skin changes, including bruising and thinning, which she attributes to aging.      Patient Active Problem List   Diagnosis Date Noted   Primary osteoarthritis of right hip 07/19/2024   Senile purpura 01/18/2024   Osteopenia 01/18/2024   Primary hypertension 01/18/2024   Panic attack 12/07/2023   Seasonal allergies 12/07/2023   Palpitations 12/07/2023   Allergic reaction 08/04/2021   COPD with acute exacerbation (HCC) 08/03/2021   Lumbar spondylosis 04/09/2021   OAB (overactive bladder) 04/09/2021   COPD (chronic obstructive pulmonary disease) (HCC) 03/07/2021   History of MI (myocardial infarction) 03/07/2021   Hypovitaminosis D 03/07/2021   Chronic respiratory failure with hypoxia (HCC) 01/09/2021   OSA (obstructive sleep apnea) 01/09/2021   Smoking greater than 40 pack years 01/09/2021   Intracranial aneurysm 08/22/2019   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   Other dysphagia 03/01/2019   S/P cervical spinal fusion 02/05/2019   Cervical spinal stenosis 10/08/2018   High risk medication use 09/22/2018   Mixed hyperlipidemia 10/29/2017   Tobacco abuse 09/08/2017   Cigarette nicotine dependence with nicotine-induced disorder 09/08/2017   Controlled type 2 diabetes mellitus with complication, without long-term current use of insulin (HCC) 07/31/2017   Gastroesophageal reflux disease without esophagitis 05/05/2017   Posttraumatic stress disorder 10/15/2016   Past Medical  History:  Diagnosis Date   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 Added automatically from request for surgery [phone]   Chronic respiratory  failure with hypoxia (HCC) 01/09/2021   Gastroesophageal reflux disease without esophagitis 05/05/2017   History of MI (myocardial infarction) 03/07/2021   Formatting of this note might be different from the original. living in [place], never cathed (early 40s ~) patient reports MD said light heart attack   Mixed hyperlipidemia 10/29/2017   OSA (obstructive sleep apnea) 01/09/2021   Posttraumatic stress disorder 10/15/2016   Social History   Tobacco Use   Smoking status: Every Day    Current packs/day: 0.50    Average packs/day: 0.5 packs/day for 57.9 years (29.0 ttl pk-yrs)    Types: Cigarettes    Start date: 1968    Passive exposure: Current   Smokeless tobacco: Never  Vaping Use   Vaping status: Never Used  Substance Use Topics   Alcohol use: Not Currently   Drug use: Never   Allergies  Allergen Reactions   Amoxicillin -Pot Clavulanate Hives    Took benadryl  and symptoms resolved in 2 days. No angioedema.   Azithromycin  Shortness Of Breath    Throat closing    Doxycycline  Anaphylaxis   Levofloxacin Itching and Rash   Ceftriaxone  Other (See Comments)    coughing, difficulty swallowing, rash - unclear if caused by ceftriaxone  - has tolerated cefazolin and cephalexin  previously   Acetaminophen    Clavulanic Acid    Nitrofurantoin Other (See Comments), Swelling and Hives    Required hospitalization   Oxycodone-Acetaminophen Other (See Comments)    Auditory hallucinations, unable to sleep   Prednisone  Swelling    CANNOT DO PILLS. Swelling in mouth.   Tape Dermatitis    Paper tape is fine   Trimethoprim    Sulfamethoxazole-Trimethoprim Rash      ROS Per HPI.    Objective:     BP 131/78   Pulse 91   Temp 98.3 F (36.8 C) (Oral)   Resp 16   Ht 5' 3.5 (1.613 m)   Wt 184 lb 1.6 oz (83.5 kg)   LMP  (LMP Unknown)   SpO2 92%   BMI 32.10 kg/m  BP Readings from Last 3 Encounters:  07/19/24 131/78  06/23/24 (!) 144/82  06/01/24 (!) 143/73   Wt Readings from Last 3  Encounters:  07/19/24 184 lb 1.6 oz (83.5 kg)  05/24/24 182 lb 11.2 oz (82.9 kg)  05/05/24 183 lb 6.4 oz (83.2 kg)   SpO2 Readings from Last 3 Encounters:  07/19/24 92%  06/23/24 94%  06/01/24 95%      Physical Exam Constitutional:      General: She is not in acute distress.    Appearance: Normal appearance. She is not ill-appearing.  HENT:     Head: Normocephalic and atraumatic.     Nose: Nose normal.  Eyes:     General: No scleral icterus.    Conjunctiva/sclera: Conjunctivae normal.  Cardiovascular:     Rate and Rhythm: Normal rate and regular rhythm.     Heart sounds: Normal heart sounds. No murmur heard.    No friction rub.  Pulmonary:     Effort: Pulmonary effort is normal. No respiratory distress.     Breath sounds: Wheezing present. No rhonchi or rales.  Musculoskeletal:        General: Normal range of motion.     Right foot: No deformity or Charcot foot.     Left foot: No deformity or Charcot foot.  Feet:     Right foot:     Protective Sensation: 9 sites tested.  6 sites sensed.     Skin integrity: Skin integrity normal. No ulcer, skin breakdown, erythema or warmth.     Toenail Condition: Right toenails are abnormally thick.     Left foot:     Protective Sensation: 9 sites tested.  6 sites sensed.     Skin integrity: Skin integrity normal. No ulcer, skin breakdown, erythema or warmth.     Toenail Condition: Left toenails are abnormally thick.     Comments: Normal pulses Corns present- follows with podiatry. 6/9 sensation bilateral. Skin:    General: Skin is warm and dry.     Coloration: Skin is not jaundiced or pale.  Neurological:     General: No focal deficit present.     Mental Status: She is alert.  Psychiatric:        Mood and Affect: Mood normal.        Behavior: Behavior normal.      No results found for any visits on 07/19/24.  Last CBC Lab Results  Component Value Date   WBC 11.6 (H) 05/24/2024   HGB 13.6 05/24/2024   HCT 41.9  05/24/2024   MCV 94.6 05/24/2024   MCH 30.7 05/24/2024   RDW 13.9 05/24/2024   PLT 205 05/24/2024   Last metabolic panel Lab Results  Component Value Date   GLUCOSE 89 05/24/2024   NA 142 05/24/2024   K 3.6 05/24/2024   CL 103 05/24/2024   CO2 29 05/24/2024   BUN 10 05/24/2024   CREATININE 0.98 05/24/2024   GFRNONAA >60 05/24/2024   CALCIUM  9.5 05/24/2024   PROT 6.4 (L) 05/24/2024   ALBUMIN 3.9 05/24/2024   LABGLOB 2.0 04/19/2024   AGRATIO 1.6 10/28/2021   BILITOT 0.4 05/24/2024   ALKPHOS 104 05/24/2024   AST 24 05/24/2024   ALT 11 05/24/2024   ANIONGAP 10 05/24/2024   Last lipids Lab Results  Component Value Date   CHOL 145 04/19/2024   HDL 62 04/19/2024   LDLCALC 63 04/19/2024   TRIG 110 04/19/2024   CHOLHDL 2.3 04/19/2024   Last hemoglobin A1c Lab Results  Component Value Date   HGBA1C 6.1 (H) 04/19/2024      The ASCVD Risk score (Arnett DK, et al., 2019) failed to calculate for the following reasons:   Risk score cannot be calculated because patient has a medical history suggesting prior/existing ASCVD    Assessment & Plan:   Assessment and Plan    Chronic obstructive pulmonary disease COPD is well-managed with no issues on inhalers. Recent CT scan showed no changes since 2024. Follow with pulm. - Continue Stiolto and albuterol  inhaler as prescribed. - Use Duo Nebs at home as needed.  Type 2 diabetes mellitus with peripheral neuropathy Diabetes management is ongoing with home blood sugar monitoring. Peripheral neuropathy with diminished sensation in feet, possibly due to diabetes or B12 deficiency. A1c was 6.1 in September, indicating good control. - Continue home blood sugar monitoring. - Continue current interventions - Performed diabetic foot exam today. - Will recheck A1c in three months.  Hip osteoarthritis Chronic hip pain localized to the buttocks, not radiating down the leg. Previous orthopedic evaluation suggested hip replacement, but she  declined. Pain  managed with muscle relaxers as needed. Sacroiliac pain versus hip OA- seen in ER with negative eval. Moderate canal stenosis on CT L spine and has follow up with spinal surgeon at the end of the month. - Continue muscle relaxers as needed, mainly at night. - Attend appointment with spinal surgeon on December 29th for further evaluation.  Mixed hyperlipidemia Well-controlled on Lipitor 40 mg. Recent cholesterol panel in September was satisfactory. - Continue Lipitor 40 mg daily.  Vitamin B12 deficiency Extremely low B12 levels with symptoms of cramps and neuropathy. Previous oral B12 supplementation caused cramps. Discussed potential for neuropathy due to low B12 and diabetes. - Start oral B12 supplementation. - Will recheck B12 levels in three months. - Will consider B12 injections if levels remain low.  Senile purpura Intermittent bruising on legs, likely due to senile purpura. Common with aging due to thinning skin and fragile vessels. - Continue to monitor for changes in bruising.  Nicotine dependence, cigarettes Currently smoking half a pack per day. Previous adverse reaction to nicotine patches. Discussed risks of Chantix due to seizure history. Recommended nicotine replacement therapy. - Use 14 mg nicotine patches for 1-2 months. - Call 1-800-QUIT-NOW for free smoking cessation supplies.  Gastroesophageal reflux disease GERD managed with Prilosec 20 mg as needed. Discussed increasing dose to 40 mg for upcoming holiday to prevent reflux symptoms. - Increase Prilosec to 40 mg three days prior to holiday events. - Use antacid liquid as needed.      I personally spent a total of 33 minutes in the care of the patient today including preparing to see the patient, getting/reviewing separately obtained history, performing a medically appropriate exam/evaluation, counseling and educating, placing orders, and documenting clinical information in the EHR.   Return in about 3  months (around 10/17/2024) for HTN, DM.    Laurice Iglesia T Flordia Kassem, [name]

## 2024-07-20 ENCOUNTER — Ambulatory Visit (HOSPITAL_BASED_OUTPATIENT_CLINIC_OR_DEPARTMENT_OTHER): Payer: Self-pay | Admitting: Student

## 2024-07-21 ENCOUNTER — Other Ambulatory Visit (HOSPITAL_BASED_OUTPATIENT_CLINIC_OR_DEPARTMENT_OTHER): Payer: Self-pay

## 2024-07-21 ENCOUNTER — Other Ambulatory Visit: Payer: Self-pay

## 2024-07-22 ENCOUNTER — Telehealth (HOSPITAL_BASED_OUTPATIENT_CLINIC_OR_DEPARTMENT_OTHER): Payer: Self-pay | Admitting: *Deleted

## 2024-07-25 ENCOUNTER — Telehealth (HOSPITAL_BASED_OUTPATIENT_CLINIC_OR_DEPARTMENT_OTHER): Payer: Self-pay

## 2024-07-25 DIAGNOSIS — F410 Panic disorder [episodic paroxysmal anxiety] without agoraphobia: Secondary | ICD-10-CM

## 2024-07-25 NOTE — Telephone Encounter (Signed)
 I called pt about labs and discussed however pt wanted to know why [facility] denied refilling her diazepam . Pt states she was given 2 tablets from Neelesh Crumsan Nundkumar, MD her MRI. Pt states she is not abusing it and needed it for anxiety. She said if [facility] cannot refill it for her she will be finding a new provider because she has had it. I advised [facility] was off for the day and will be returning tomorrow.

## 2024-07-26 ENCOUNTER — Other Ambulatory Visit (HOSPITAL_BASED_OUTPATIENT_CLINIC_OR_DEPARTMENT_OTHER): Payer: Self-pay

## 2024-07-26 ENCOUNTER — Other Ambulatory Visit (HOSPITAL_BASED_OUTPATIENT_CLINIC_OR_DEPARTMENT_OTHER): Payer: Self-pay | Admitting: Student

## 2024-07-26 DIAGNOSIS — J441 Chronic obstructive pulmonary disease with (acute) exacerbation: Secondary | ICD-10-CM

## 2024-07-26 MED ORDER — ALBUTEROL SULFATE HFA 108 (90 BASE) MCG/ACT IN AERS
2.0000 | INHALATION_SPRAY | Freq: Four times a day (QID) | RESPIRATORY_TRACT | 3 refills | Status: AC | PRN
  Filled 2024-07-26: qty 6.7, 25d supply, fill #0
  Filled 2024-08-15: qty 6.7, 25d supply, fill #1
  Filled 2024-09-14: qty 6.7, 25d supply, fill #2

## 2024-07-26 MED ORDER — DIAZEPAM 5 MG PO TABS
5.0000 mg | ORAL_TABLET | Freq: Two times a day (BID) | ORAL | 0 refills | Status: DC | PRN
  Filled 2024-07-26: qty 30, 15d supply, fill #0

## 2024-07-26 NOTE — Telephone Encounter (Signed)
 Pt advised we did not receive a refill for diazepam . Pt advised it has been refilled today.

## 2024-07-27 LAB — HM DIABETES EYE EXAM

## 2024-07-28 ENCOUNTER — Other Ambulatory Visit (HOSPITAL_BASED_OUTPATIENT_CLINIC_OR_DEPARTMENT_OTHER): Payer: Self-pay

## 2024-08-05 NOTE — Telephone Encounter (Signed)
 Pharmacy was called.

## 2024-08-15 ENCOUNTER — Other Ambulatory Visit (HOSPITAL_BASED_OUTPATIENT_CLINIC_OR_DEPARTMENT_OTHER): Payer: Self-pay

## 2024-08-16 ENCOUNTER — Other Ambulatory Visit (HOSPITAL_BASED_OUTPATIENT_CLINIC_OR_DEPARTMENT_OTHER): Payer: Self-pay

## 2024-08-17 ENCOUNTER — Encounter (HOSPITAL_BASED_OUTPATIENT_CLINIC_OR_DEPARTMENT_OTHER): Payer: Self-pay | Admitting: Student

## 2024-08-17 ENCOUNTER — Ambulatory Visit (INDEPENDENT_AMBULATORY_CARE_PROVIDER_SITE_OTHER): Admitting: Student

## 2024-08-17 ENCOUNTER — Ambulatory Visit (HOSPITAL_BASED_OUTPATIENT_CLINIC_OR_DEPARTMENT_OTHER)
Admission: RE | Admit: 2024-08-17 | Discharge: 2024-08-17 | Disposition: A | Discharge status: Home / Self Care | Source: Ambulatory Visit | Attending: Student | Admitting: Student

## 2024-08-17 VITALS — BP 147/84 | HR 87 | Temp 98.0°F | Resp 16 | Ht 63.5 in | Wt 183.0 lb

## 2024-08-17 DIAGNOSIS — R39851 Costovertebral (angle) tenderness, right side: Secondary | ICD-10-CM

## 2024-08-17 DIAGNOSIS — R300 Dysuria: Secondary | ICD-10-CM

## 2024-08-17 LAB — POCT URINALYSIS DIP (CLINITEK)
Bilirubin, UA: NEGATIVE
Glucose, UA: NEGATIVE mg/dL
Ketones, POC UA: NEGATIVE mg/dL
Leukocytes, UA: NEGATIVE
Nitrite, UA: NEGATIVE
POC PROTEIN,UA: NEGATIVE
Spec Grav, UA: 1.01
Urobilinogen, UA: 0.2 U/dL
pH, UA: 6.5

## 2024-08-17 NOTE — Progress Notes (Signed)
 "  Acute Office Visit  Subjective:     Patient ID: Kaitlyn Rosales, female    DOB: [birth year], 70 y.o.   MRN: [id number]  Chief Complaint  Patient presents with   Urinary Tract Infection    Pt would like kidneys checked. Believes she may have a UTI. Pt states urine smells. Right side of back hurt.     HPI  Discussed the use of AI scribe software for clinical note transcription with the patient, who gave verbal consent to proceed.  History of Present Illness   Kaitlyn Rosales is a 69 year old female who presents with right-sided kidney pain and concerns of a possible UTI.  She has been experiencing right-sided kidney pain for the past two months. The pain began suddenly, described as hitting her 'like a rock,' and was severe enough to bring her to her knees. Initially, she sought care at the ER, where a CT scan was performed. Despite a history of right-sided hip pain attributed to arthritis and a potential need for hip replacement, she notes that her current pain is different, having moved from her buttocks to her kidney area. The pain is not radiating down her leg, unlike previous sciatic nerve pain.  She has been experiencing some urinary symptoms, but did not report pain with urination or visible blood in her urine. No visible hematuria, but a trace amount of blood was found in a recent urine test. She has been taking vitamin D3 supplements for several years as advised by a kidney specialist due to borderline kidney function.  She recently experienced significant bowel issues, requiring an enema administered by her daughter to relieve constipation. Since then, she has been using Citrucel fiber to maintain regular bowel movements. She has a history of gallbladder stones but was informed that no kidney stones were detected in previous imaging.       ROS Per HPI     Objective:    BP (!) 147/84   Pulse 87   Temp 98 F (36.7 C) (Oral)   Resp 16   Ht 5' 3.5 (1.613 m)   Wt 183 lb  (83 kg)   LMP  (LMP Unknown)   SpO2 94%   BMI 31.91 kg/m  BP Readings from Last 3 Encounters:  08/17/24 (!) 147/84  07/19/24 131/78  06/23/24 (!) 144/82   Wt Readings from Last 3 Encounters:  08/17/24 183 lb (83 kg)  07/19/24 184 lb 1.6 oz (83.5 kg)  05/24/24 182 lb 11.2 oz (82.9 kg)   SpO2 Readings from Last 3 Encounters:  08/17/24 94%  07/19/24 92%  06/23/24 94%      Physical Exam Constitutional:      General: She is not in acute distress.    Appearance: Normal appearance. She is not ill-appearing.  HENT:     Head: Normocephalic and atraumatic.     Nose: Nose normal.  Eyes:     General: No scleral icterus.    Conjunctiva/sclera: Conjunctivae normal.  Cardiovascular:     Rate and Rhythm: Normal rate and regular rhythm.     Heart sounds: Normal heart sounds. No murmur heard.    No friction rub.  Pulmonary:     Effort: Pulmonary effort is normal. No respiratory distress.     Breath sounds: Normal breath sounds. No wheezing, rhonchi or rales.  Abdominal:     Tenderness: There is right CVA tenderness. There is no left CVA tenderness.  Musculoskeletal:        General: Normal range  of motion.  Skin:    General: Skin is warm and dry.     Coloration: Skin is not jaundiced or pale.  Neurological:     General: No focal deficit present.     Mental Status: She is alert.  Psychiatric:        Mood and Affect: Mood normal.        Behavior: Behavior normal.     Results for orders placed or performed in visit on 08/17/24  Urine Culture   Specimen: Urine   UR  Result Value Ref Range   Urine Culture, Routine Final report    Organism ID, Bacteria No growth   POCT URINALYSIS DIP (CLINITEK)  Result Value Ref Range   Color, UA light yellow (A) yellow   Clarity, UA clear clear   Glucose, UA negative negative mg/dL   Bilirubin, UA negative negative   Ketones, POC UA negative negative mg/dL   Spec Grav, UA 8.989 8.989 - 1.025   Blood, UA trace-intact (A) negative   pH,  UA 6.5 5.0 - 8.0   POC PROTEIN,UA negative negative, trace   Urobilinogen, UA 0.2 0.2 or 1.0 E.U./dL   Nitrite, UA Negative Negative   Leukocytes, UA Negative Negative        Assessment & Plan:   Assessment and Plan    R CVA tenderness Right-sided flank pain for two months, worsening recently- history of back pain on this side but over the last week or so she had some acute worsening. Physical exam revealed extreme tenderness to percussion. Differential diagnosis includes kidney infection, kidney stone, or muscular issue. No signs of infection in urine analysis, but trace blood present. Pain not radiating down the leg, distinguishing from previous sciatic nerve pain. - Ordered abdominal x-ray to assess for kidney stones. - Cultured urine to rule out infection. - If x-ray and urine culture are normal, will consider CT scan for further evaluation.      Return if symptoms worsen or fail to improve.  Harvin Konicek T Phuc Kluttz, [name]  "

## 2024-08-18 ENCOUNTER — Ambulatory Visit (HOSPITAL_BASED_OUTPATIENT_CLINIC_OR_DEPARTMENT_OTHER): Payer: Self-pay | Admitting: Student

## 2024-08-19 LAB — URINE CULTURE: Organism ID, Bacteria: NO GROWTH

## 2024-08-19 NOTE — Patient Instructions (Signed)
 It was nice to see you today!  If you have any problems before your next visit feel free to message me via MyChart (minor issues or questions) or call the office, otherwise you may reach out to schedule an office visit.  Thank you! Pau Banh, [name]

## 2024-08-24 ENCOUNTER — Inpatient Hospital Stay: Admitting: Hematology and Oncology

## 2024-08-24 ENCOUNTER — Encounter (HOSPITAL_BASED_OUTPATIENT_CLINIC_OR_DEPARTMENT_OTHER): Payer: Self-pay

## 2024-08-24 ENCOUNTER — Inpatient Hospital Stay

## 2024-08-30 ENCOUNTER — Ambulatory Visit (HOSPITAL_BASED_OUTPATIENT_CLINIC_OR_DEPARTMENT_OTHER)
Admission: RE | Admit: 2024-08-30 | Discharge: 2024-08-30 | Disposition: A | Discharge status: Home / Self Care | Source: Ambulatory Visit | Attending: Family Medicine | Admitting: Family Medicine

## 2024-08-30 ENCOUNTER — Ambulatory Visit (HOSPITAL_BASED_OUTPATIENT_CLINIC_OR_DEPARTMENT_OTHER): Admit: 2024-08-30 | Discharge: 2024-08-30 | Disposition: A | Admitting: Radiology

## 2024-08-30 ENCOUNTER — Encounter (HOSPITAL_BASED_OUTPATIENT_CLINIC_OR_DEPARTMENT_OTHER): Payer: Self-pay

## 2024-08-30 VITALS — BP 151/82 | HR 88 | Temp 98.1°F | Resp 20

## 2024-08-30 DIAGNOSIS — J441 Chronic obstructive pulmonary disease with (acute) exacerbation: Secondary | ICD-10-CM

## 2024-08-30 DIAGNOSIS — R051 Acute cough: Secondary | ICD-10-CM

## 2024-08-30 MED ORDER — IPRATROPIUM-ALBUTEROL 0.5-2.5 (3) MG/3ML IN SOLN
3.0000 mL | Freq: Once | RESPIRATORY_TRACT | Status: AC
  Administered 2024-08-30: 3 mL via RESPIRATORY_TRACT

## 2024-08-30 MED ORDER — TRIAMCINOLONE ACETONIDE 40 MG/ML IJ SUSP
80.0000 mg | Freq: Once | INTRAMUSCULAR | Status: AC
  Administered 2024-08-30: 80 mg via INTRAMUSCULAR

## 2024-08-30 NOTE — Discharge Instructions (Addendum)
 COPD exacerbation/bronchitis with cough: Chest x-ray shows some changes of COPD or bronchitis but no pneumonia.  Breath sounds improved with DuoNeb treatment.  This is a viral or even a COPD exacerbation not pneumonia so antibiotics are not needed.  Continue inhalers as previously prescribed.  Patient does not tolerate oral prednisone  but has done well with Kenalog .  Kenalog  80 mg injection now.  Make an appointment with primary care for recheck in 3 weeks or sooner as needed.  Follow-up here if needed.

## 2024-08-30 NOTE — ED Provider Notes (Signed)
 " [facility]    CSN: [id number] Arrival date & time: 08/30/24  1217      History   Chief Complaint Chief Complaint  Patient presents with   Cough    Had a cough for 2 weeks and I get horse - Entered by patient    HPI Kaitlyn Rosales is a 69 y.o. female.   69 year old female who reports cough since approximately 08/17/2024 or earlier.  The coughing has made her hoarse.  She now has some throat pain.  She has a history of COPD and she uses oxygen  at night.  The patient is concerned that she has bronchitis and possibly even pneumonia.   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 Added automatically from request for surgery [id number]   Chronic respiratory failure with hypoxia (HCC) 01/09/2021   Gastroesophageal reflux disease without esophagitis 05/05/2017   History of MI (myocardial infarction) 03/07/2021   Formatting of this note might be different from the original. living in [place], never cathed (early 40s ~) patient reports MD said light heart attack   Mixed hyperlipidemia 10/29/2017   OSA (obstructive sleep apnea) 01/09/2021   Posttraumatic stress disorder 10/15/2016    Patient Active Problem List   Diagnosis Date Noted   Primary osteoarthritis of right hip 07/19/2024   Senile purpura 01/18/2024   Osteopenia 01/18/2024   Primary hypertension 01/18/2024   Panic attack 12/07/2023   Seasonal allergies 12/07/2023   Palpitations 12/07/2023   Allergic reaction 08/04/2021   COPD with acute exacerbation (HCC) 08/03/2021   Lumbar spondylosis 04/09/2021   OAB (overactive  bladder) 04/09/2021   COPD (chronic obstructive pulmonary disease) (HCC) 03/07/2021   History of MI (myocardial infarction) 03/07/2021   Hypovitaminosis D 03/07/2021   Chronic respiratory failure with hypoxia (HCC) 01/09/2021   OSA (obstructive sleep apnea) 01/09/2021   Smoking greater than 40 pack years 01/09/2021   Intracranial aneurysm 08/22/2019   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   Other dysphagia 03/01/2019   S/P cervical spinal fusion 02/05/2019   Cervical spinal stenosis 10/08/2018   High risk medication use 09/22/2018   Mixed hyperlipidemia 10/29/2017   Tobacco abuse 09/08/2017   Cigarette nicotine dependence with nicotine-induced disorder 09/08/2017   Controlled type 2 diabetes mellitus with complication, without long-term current use of insulin (HCC) 07/31/2017   Gastroesophageal reflux disease without esophagitis 05/05/2017   Posttraumatic stress disorder 10/15/2016    Past Surgical History:  Procedure Laterality Date   APPENDECTOMY  1970   CERVICAL SPINE SURGERY  2020   CHOLECYSTECTOMY  1970   ELBOW SURGERY Right 1990   PARTIAL HYSTERECTOMY  1990    OB History     Gravida  3   Para  3   Term  2   Preterm  1   AB  0   Living  2      SAB      IAB      Ectopic      Multiple      Live Births  Home Medications    Prior to Admission medications  Medication Sig Start Date End Date Taking? Authorizing Provider  Accu-Chek Softclix Lancets lancets Use as directed to check blood sugar three times daily. 06/20/24  Yes Rothfuss, Jacob T, [name]  acetaminophen (TYLENOL) 325 MG tablet Take 325 mg by mouth as needed (prn).   Yes [provider]  albuterol  (VENTOLIN  HFA) 108 (90 Base) MCG/ACT inhaler Inhale 2 puffs into the lungs every 6 (six) hours as needed. 07/26/24  Yes Rothfuss, Jacob T, [name]  atorvastatin  (LIPITOR) 40 MG tablet Take 1 tablet (40 mg total) by mouth daily for cholesterol  06/20/24  Yes Rothfuss, Jacob T, [name]  Blood Glucose Monitoring Suppl (ACCU-CHEK GUIDE ME) w/Device KIT by Does not apply route.   Yes [provider]  cetirizine  (ZYRTEC ) 10 MG tablet Take 1 tablet (10 mg total) by mouth daily. Patient taking differently: Take 10 mg by mouth as needed. 06/23/24  Yes Ival Domino, FNP  Cholecalciferol (VITAMIN D3) 250 MCG (10000 UT) TABS Take 10,000 Units by mouth daily. 01/20/19  Yes [provider]  cyanocobalamin  (VITAMIN B12) 1000 MCG tablet Take 1,000 mcg by mouth daily. 05/24/24  Yes Harl Setter A, NP  diazepam  (VALIUM ) 5 MG tablet Take 1 tablet (5 mg total) by mouth every 12 (twelve) hours as needed for anxiety. 07/26/24  Yes Rothfuss, Jacob T, [name]  EPINEPHrine  (EPIPEN  2-PAK) 0.3 mg/0.3 mL IJ SOAJ injection Inject 0.3 mg into the muscle as needed for anaphylaxis. 03/18/24  Yes Rothfuss, Jacob T, [name]  ipratropium-albuterol  (DUONEB) 0.5-2.5 (3) MG/3ML SOLN Take 3 mLs by nebulization every 6 (six) hours. 06/20/24  Yes Rothfuss, Jacob T, [name]  omeprazole (PRILOSEC) 20 MG capsule Take 20 mg by mouth daily.   Yes [provider]  Tiotropium Bromide -Olodaterol (STIOLTO RESPIMAT ) 2.5-2.5 MCG/ACT AERS Inhale 2 puffs into the lungs daily. 06/16/24  Yes   tiZANidine  (ZANAFLEX ) 4 MG tablet Take 1 tablet (4 mg total) by mouth every 6 (six) hours as needed for muscle spasms. 07/04/24  Yes Rothfuss, Lang DASEN, [name]  glucose blood (ACCU-CHEK GUIDE TEST) test strip Use to check glucose 3 times daily as instructed 01/18/24   Rothfuss, Jacob T, [name]  meclizine  (ANTIVERT ) 25 MG tablet Take 1 tablet (25 mg total) by mouth 3 (three) times daily as needed for dizziness. 06/01/24   Raspet, Erin K, [name]  OXYGEN  2 L by Does not apply route at bedtime. Patient taking differently: 2 L by Does not apply route as needed.    [provider]    Family History Family History  Problem Relation Age of Onset   Dementia Mother    Parkinson's disease  Mother    Cirrhosis Father    Osteoarthritis Sister    Breast cancer Sister    COPD Sister    COPD Brother     Social History Social History[1]   Allergies   Amoxicillin -pot clavulanate, Azithromycin , Doxycycline , Levofloxacin, Ceftriaxone , Acetaminophen, Clavulanic acid, Nitrofurantoin, Oxycodone-acetaminophen, Prednisone , Tape, Trimethoprim, and Sulfamethoxazole-trimethoprim   Review of Systems Review of Systems  Constitutional:  Negative for chills and fever.  HENT:  Positive for sore throat and voice change. Negative for ear pain.   Eyes:  Negative for pain and visual disturbance.  Respiratory:  Positive for cough, chest tightness, shortness of breath and wheezing.   Cardiovascular:  Negative for chest pain and palpitations.  Gastrointestinal:  Negative for abdominal pain, constipation, diarrhea, nausea and vomiting.  Genitourinary:  Negative for dysuria and hematuria.  Musculoskeletal:  Negative for arthralgias and back pain.  Skin:  Negative for color change and rash.  Neurological:  Negative for seizures and syncope.  All other systems reviewed and are negative.    Physical Exam Triage Vital Signs ED Triage Vitals  Encounter Vitals Group     BP 08/30/24 1302 (!) 151/82     Girls Systolic BP Percentile --      Girls Diastolic BP Percentile --      Boys Systolic BP Percentile --      Boys Diastolic BP Percentile --      Pulse Rate 08/30/24 1302 88     Resp 08/30/24 1302 20     Temp 08/30/24 1302 98.1 F (36.7 C)     Temp Source 08/30/24 1302 Oral     SpO2 08/30/24 1302 93 %     Weight --      Height --      Head Circumference --      Peak Flow --      Pain Score 08/30/24 1300 0     Pain Loc --      Pain Education --      Exclude from Growth Chart --    No data found.  Updated Vital Signs BP (!) 151/82 (BP Location: Right Arm)   Pulse 88   Temp 98.1 F (36.7 C) (Oral)   Resp 20   LMP  (LMP Unknown)   SpO2 93%   Visual Acuity Right Eye Distance:    Left Eye Distance:   Bilateral Distance:    Right Eye Near:   Left Eye Near:    Bilateral Near:     Physical Exam Vitals and nursing note reviewed.  Constitutional:      General: She is not in acute distress.    Appearance: She is well-developed. She is not ill-appearing, toxic-appearing or diaphoretic.  HENT:     Head: Normocephalic and atraumatic.     Right Ear: Hearing, tympanic membrane, ear canal and external ear normal.     Left Ear: Hearing, tympanic membrane, ear canal and external ear normal.     Nose: Congestion and rhinorrhea present. Rhinorrhea is clear.     Right Sinus: No maxillary sinus tenderness or frontal sinus tenderness.     Left Sinus: No maxillary sinus tenderness or frontal sinus tenderness.     Mouth/Throat:     Lips: Pink.     Mouth: Mucous membranes are moist.     Pharynx: Uvula midline. No oropharyngeal exudate or posterior oropharyngeal erythema.     Tonsils: No tonsillar exudate.  Eyes:     Conjunctiva/sclera: Conjunctivae normal.     Pupils: Pupils are equal, round, and reactive to light.  Cardiovascular:     Rate and Rhythm: Normal rate and regular rhythm.     Heart sounds: S1 normal and S2 normal. No murmur heard. Pulmonary:     Effort: Pulmonary effort is normal. No respiratory distress.     Breath sounds: Examination of the right-upper field reveals wheezing and rhonchi. Examination of the left-upper field reveals wheezing and rhonchi. Examination of the right-middle field reveals wheezing. Examination of the left-middle field reveals wheezing. Examination of the right-lower field reveals decreased breath sounds. Examination of the left-lower field reveals decreased breath sounds. Decreased breath sounds, wheezing and rhonchi present. No rales.     Comments: Inspiratory wheezes with rhonchi in the upper lobes and decreased breath sounds in the bases.  Oxygen  saturation is 93% on room air.  Reassessment  after DuoNeb treatment: Abdominal:      General: Bowel sounds are normal.     Palpations: Abdomen is soft.     Tenderness: There is no abdominal tenderness.  Musculoskeletal:        General: No swelling.     Cervical back: Neck supple.  Lymphadenopathy:     Head:     Right side of head: No submental, submandibular, tonsillar, preauricular or posterior auricular adenopathy.     Left side of head: No submental, submandibular, tonsillar, preauricular or posterior auricular adenopathy.     Cervical: No cervical adenopathy.     Right cervical: No superficial cervical adenopathy.    Left cervical: No superficial cervical adenopathy.  Skin:    General: Skin is warm.     Capillary Refill: Capillary refill takes less than 2 seconds.     Findings: No rash.  Neurological:     Mental Status: She is alert and oriented to person, place, and time.  Psychiatric:        Mood and Affect: Mood normal.      UC Treatments / Results  Labs (all labs ordered are listed, but only abnormal results are displayed) Labs Reviewed - No data to display  EKG   Radiology DG Chest 2 View Result Date: 08/30/2024 EXAM: 2 VIEW(S) XRAY OF THE CHEST 08/30/2024 01:37:58 PM COMPARISON: 06/01/2024 CLINICAL HISTORY: cough cough cough cough cough FINDINGS: LUNGS AND PLEURA: No focal pulmonary opacity. No pleural effusion. No pneumothorax. HEART AND MEDIASTINUM: Aortic arch calcifications. No acute abnormality of the cardiac and mediastinal silhouettes. BONES AND SOFT TISSUES: Right upper quadrant cholecystectomy clips noted. ACDF hardware in lower cervical spine. No acute osseous abnormality. IMPRESSION: 1. No acute cardiopulmonary process. Electronically signed by: Norman Gatlin MD 08/30/2024 01:53 PM EST RP Workstation: [id number]    Procedures Procedures (including critical care time)  Medications Ordered in UC Medications  ipratropium-albuterol  (DUONEB) 0.5-2.5 (3) MG/3ML nebulizer solution 3 mL (3 mLs Nebulization Given 08/30/24 1339)  triamcinolone   acetonide (KENALOG -40) injection 80 mg (80 mg Intramuscular Given 08/30/24 1359)    Initial Impression / Assessment and Plan / UC Course  I have reviewed the triage vital signs and the nursing notes.  Pertinent labs & imaging results that were available during my care of the patient were reviewed by me and considered in my medical decision making (see chart for details).  Plan of Care (see discharge instructions for additional patient precautions and education): COPD exacerbation/bronchitis with cough: Chest x-ray shows some changes of COPD or bronchitis but no pneumonia.  Breath sounds improved with DuoNeb treatment.  This is a viral or even a COPD exacerbation not pneumonia so antibiotics are not needed.  Continue inhalers as previously prescribed.  Patient does not tolerate oral prednisone  but has done well with Kenalog .  Kenalog  80 mg injection now.  Make an appointment with primary care for recheck in 3 weeks or sooner as needed.  Follow-up here if needed.  I reviewed the plan of care with the patient and/or the patient's guardian.  The patient and/or guardian had time to ask questions and acknowledged that the questions were answered.  Final Clinical Impressions(s) / UC Diagnoses   Final diagnoses:  Acute cough  COPD exacerbation (HCC)     Discharge Instructions      COPD exacerbation/bronchitis with cough: Chest x-ray shows some changes of COPD or bronchitis but no pneumonia.  Breath sounds improved with DuoNeb treatment.  This is a viral or even a COPD exacerbation not  pneumonia so antibiotics are not needed.  Continue inhalers as previously prescribed.  Patient does not tolerate oral prednisone  but has done well with Kenalog .  Kenalog  80 mg injection now.  Make an appointment with primary care for recheck in 3 weeks or sooner as needed.  Follow-up here if needed.     ED Prescriptions   None    PDMP not reviewed this encounter.    [1]  Social History Tobacco Use    Smoking status: Every Day    Current packs/day: 0.50    Average packs/day: 0.5 packs/day for 58.1 years (29.0 ttl pk-yrs)    Types: Cigarettes    Start date: 1968    Passive exposure: Current   Smokeless tobacco: Never  Vaping Use   Vaping status: Never Used  Substance Use Topics   Alcohol use: Not Currently   Drug use: Never     Ival Domino, FNP 08/30/24 1400  "

## 2024-08-30 NOTE — ED Triage Notes (Addendum)
 Pt c/o coughing for 2 weeks which has made her hoarse and now she has a pain to the side of her throat. Pt has hx of COPD, pt also wear oxygen  at night

## 2024-08-31 ENCOUNTER — Other Ambulatory Visit (HOSPITAL_BASED_OUTPATIENT_CLINIC_OR_DEPARTMENT_OTHER): Payer: Self-pay

## 2024-09-01 ENCOUNTER — Other Ambulatory Visit (HOSPITAL_BASED_OUTPATIENT_CLINIC_OR_DEPARTMENT_OTHER): Payer: Self-pay

## 2024-09-02 ENCOUNTER — Ambulatory Visit: Admitting: Podiatry

## 2024-09-02 ENCOUNTER — Encounter: Payer: Self-pay | Admitting: Podiatry

## 2024-09-02 DIAGNOSIS — B351 Tinea unguium: Secondary | ICD-10-CM

## 2024-09-02 DIAGNOSIS — M79674 Pain in right toe(s): Secondary | ICD-10-CM | POA: Diagnosis not present

## 2024-09-02 DIAGNOSIS — E1151 Type 2 diabetes mellitus with diabetic peripheral angiopathy without gangrene: Secondary | ICD-10-CM | POA: Diagnosis not present

## 2024-09-02 DIAGNOSIS — M79675 Pain in left toe(s): Secondary | ICD-10-CM

## 2024-09-02 DIAGNOSIS — L84 Corns and callosities: Secondary | ICD-10-CM | POA: Diagnosis not present

## 2024-09-02 NOTE — Progress Notes (Signed)
 "      Subjective:  Patient ID: Kaitlyn Rosales, female    DOB: [birth year],  MRN: [id number]  Kaitlyn Rosales presents to clinic today for:  Chief Complaint  Patient presents with   [facility]    [facility] with callus/corn. Right hallux nail, the tip is digging into the skin. She has an inner-digital corn on the right 5th, and a callus on the lateral side of 5 as well.  A1c 6.5 in Sep.  No anti coag, still smoking  0.5 a pack a day, down from a pack and a half.    Patient notes nails are thick and elongated, causing pain in shoe gear when ambulating.  She has painful calluses and corns on the fifth toes, left submet 5, and the first MPJ bilateral.  PCP is Rothfuss, Lang DASEN, [name].  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 Added automatically from request for surgery [id number]   Chronic respiratory failure with hypoxia (HCC) 01/09/2021   Gastroesophageal reflux disease without esophagitis 05/05/2017   History of MI (myocardial infarction) 03/07/2021   Formatting of this note might be different from the original. living in [place], never cathed (early 40s ~) patient reports MD said light heart attack   Mixed hyperlipidemia 10/29/2017   OSA (obstructive sleep apnea) 01/09/2021   Posttraumatic stress disorder 10/15/2016   Allergies  Allergen Reactions   Amoxicillin -Pot Clavulanate Hives    Took benadryl  and symptoms resolved in 2 days. No angioedema.   Azithromycin  Shortness Of Breath    Throat closing    Doxycycline  Anaphylaxis   Levofloxacin Itching and Rash   Ceftriaxone  Other (See Comments)    coughing, difficulty swallowing, rash - unclear if caused by ceftriaxone  - has tolerated cefazolin and cephalexin  previously   Acetaminophen     Clavulanic Acid    Nitrofurantoin Other (See Comments), Swelling and Hives    Required hospitalization   Oxycodone-Acetaminophen Other (See Comments)    Auditory hallucinations, unable to sleep   Prednisone  Swelling    CANNOT DO PILLS. Swelling in mouth.   Tape Dermatitis    Paper tape is fine   Trimethoprim    Sulfamethoxazole-Trimethoprim Rash    Objective:  Kaitlyn Rosales is a pleasant 69 y.o. female in NAD. AAO x 3.  Vascular Examination: Patient has palpable DP pulse, absent PT pulse bilateral.  Delayed capillary refill bilateral toes.  Sparse digital hair bilateral.  Proximal to distal cooling WNL bilateral.    Dermatological Examination: Interspaces are clear with no open lesions noted bilateral.  Skin is shiny and atrophic bilateral.  Nails are 3-30mm thick, with yellowish/brown discoloration, subungual debris and distal onycholysis x10.  There is pain with compression of nails x10.  There are hyperkeratotic lesions noted bilateral distal fifth toe, left submet 5, and plantar medial first MPJ bilateral.     Latest Ref Rng & Units 07/19/2024   11:06 AM 04/19/2024   11:09 AM 01/18/2024   10:23 AM  Hemoglobin A1C  Hemoglobin-A1c 4.8 - 5.6 % 6.1  6.1  5.9    Patient qualifies for at-risk foot care because of diabetes with PVD.  Assessment/Plan: 1. Pain due to onychomycosis of toenails of both feet   2. Callus of foot   3. Type  II diabetes mellitus with peripheral circulatory disorder (HCC)     Mycotic nails x10 were sharply debrided with sterile nail nippers and power debriding burr to decrease bulk and length.  Hyperkeratotic lesions x 5 were shaved with #312 blade.  3 of the lesions are proximal to the toe DIPJ's.  A Medicaid insurance waiver has been signed for the callus shaving today since Medicaid does not cover shaving of any lesions.  This is her secondary insurance and the ABN is on file from when she signed it on 06/03/2024  Return in about 3 months (around  12/01/2024) for [facility].   Awanda CHARM Imperial, DPM, FACFAS [facility]     [address] [place], [place] [address]                Office [phone]  Fax [phone] "

## 2024-09-03 ENCOUNTER — Encounter (HOSPITAL_BASED_OUTPATIENT_CLINIC_OR_DEPARTMENT_OTHER): Payer: Self-pay

## 2024-09-03 ENCOUNTER — Inpatient Hospital Stay (HOSPITAL_BASED_OUTPATIENT_CLINIC_OR_DEPARTMENT_OTHER): Admission: RE | Admit: 2024-09-03 | Discharge: 2024-09-03 | Attending: Family Medicine | Admitting: Family Medicine

## 2024-09-03 VITALS — BP 146/83 | HR 100 | Temp 98.5°F | Resp 20

## 2024-09-03 DIAGNOSIS — J441 Chronic obstructive pulmonary disease with (acute) exacerbation: Secondary | ICD-10-CM | POA: Diagnosis not present

## 2024-09-03 DIAGNOSIS — J189 Pneumonia, unspecified organism: Secondary | ICD-10-CM

## 2024-09-03 MED ORDER — CEFDINIR 300 MG PO CAPS: 300.0000 mg | ORAL_CAPSULE | Freq: Two times a day (BID) | ORAL | 0 refills | Status: DC

## 2024-09-03 NOTE — ED Provider Notes (Signed)
 " [facility]    CSN: [id number] Arrival date & time: 09/03/24  9071      History   Chief Complaint Chief Complaint  Patient presents with   Follow-up    Still not feeling well from my last visit there on Tuesday. Cough, hard to breathe - Entered by patient    HPI Kaitlyn Rosales is a 69 y.o. female.   69 year old female who reports cough since approximately 08/17/2024 or earlier.  The coughing has made her hoarse.  She was seen on 08/30/24 for reported throat pain and cough.  She has a history of COPD and she uses oxygen  at night.  Her chest x-ray was negative on 08/30/2024.  Her exam was consistent with a COPD exacerbation.  She has not tolerated prednisone  in the past and elected to have Kenalog  80 mg injection during the visit.  She is now reporting headaches, chest and rib pain, cough, shortness of breath.  She is using her inhalers.  She is concerned that she might need an antibiotic.  She has used cefdinir  in the past and find it helpful for her respiratory concerns.     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 Added automatically from request for surgery [phone]   Chronic respiratory failure with hypoxia (HCC) 01/09/2021   Gastroesophageal reflux disease without esophagitis 05/05/2017   History of MI (myocardial infarction) 03/07/2021   Formatting of this note might be different from the original. living in [place], never cathed (early 40s ~) patient reports MD said light heart attack   Mixed hyperlipidemia 10/29/2017   OSA (obstructive sleep apnea) 01/09/2021   Posttraumatic stress disorder 10/15/2016    Patient Active Problem List   Diagnosis Date Noted   Primary osteoarthritis of right hip 07/19/2024   Senile purpura  01/18/2024   Osteopenia 01/18/2024   Primary hypertension 01/18/2024   Panic attack 12/07/2023   Seasonal allergies 12/07/2023   Palpitations 12/07/2023   Allergic reaction 08/04/2021   COPD with acute exacerbation (HCC) 08/03/2021   Lumbar spondylosis 04/09/2021   OAB (overactive bladder) 04/09/2021   COPD (chronic obstructive pulmonary disease) (HCC) 03/07/2021   History of MI (myocardial infarction) 03/07/2021   Hypovitaminosis D 03/07/2021   Chronic respiratory failure with hypoxia (HCC) 01/09/2021   OSA (obstructive sleep apnea) 01/09/2021   Smoking greater than 40 pack years 01/09/2021   Intracranial aneurysm 08/22/2019   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   Other dysphagia 03/01/2019   S/P cervical spinal fusion 02/05/2019   Cervical spinal stenosis 10/08/2018   High risk medication use 09/22/2018   Mixed hyperlipidemia 10/29/2017   Tobacco abuse 09/08/2017   Cigarette nicotine dependence with nicotine-induced disorder 09/08/2017   Controlled type 2 diabetes mellitus with complication, without long-term current use of insulin (HCC) 07/31/2017   Type 2 diabetes mellitus without complication, without long-term current use of insulin (HCC) 07/31/2017   Gastroesophageal reflux disease without esophagitis 05/05/2017   Posttraumatic stress disorder 10/15/2016    Past Surgical History:  Procedure Laterality Date   APPENDECTOMY  1970   CERVICAL SPINE SURGERY  2020   CHOLECYSTECTOMY  1970   ELBOW SURGERY Right 1990   PARTIAL HYSTERECTOMY  1990    OB History  Gravida  3   Para  3   Term  2   Preterm  1   AB  0   Living  2      SAB      IAB      Ectopic      Multiple      Live Births               Home Medications    Prior to Admission medications  Medication Sig Start Date End Date Taking? Authorizing Provider  cefdinir  (OMNICEF ) 300 MG capsule Take 1 capsule (300 mg total) by mouth 2 (two) times daily  for 10 days. 09/03/24 09/13/24 Yes Ival Domino, FNP  Accu-Chek Softclix Lancets lancets Use as directed to check blood sugar three times daily. 06/20/24   Rothfuss, Jacob T, [name]  acetaminophen (TYLENOL) 325 MG tablet Take 325 mg by mouth as needed (prn).    [provider]  albuterol  (VENTOLIN  HFA) 108 (90 Base) MCG/ACT inhaler Inhale 2 puffs into the lungs every 6 (six) hours as needed. 07/26/24   Rothfuss, Jacob T, [name]  atorvastatin  (LIPITOR) 40 MG tablet Take 1 tablet (40 mg total) by mouth daily for cholesterol 06/20/24   Rothfuss, Jacob T, [name]  Blood Glucose Monitoring Suppl (ACCU-CHEK GUIDE ME) w/Device KIT by Does not apply route.    [provider]  cetirizine  (ZYRTEC ) 10 MG tablet Take 1 tablet (10 mg total) by mouth daily. Patient taking differently: Take 10 mg by mouth as needed. 06/23/24   Ival Domino, FNP  Cholecalciferol (VITAMIN D3) 250 MCG (10000 UT) TABS Take 10,000 Units by mouth daily. 01/20/19   [provider]  cyanocobalamin  (VITAMIN B12) 1000 MCG tablet Take 1,000 mcg by mouth daily. 05/24/24   Harl Eleanor LABOR, NP  diazepam  (VALIUM ) 5 MG tablet Take 1 tablet (5 mg total) by mouth every 12 (twelve) hours as needed for anxiety. 07/26/24   Rothfuss, Jacob T, [name]  EPINEPHrine  (EPIPEN  2-PAK) 0.3 mg/0.3 mL IJ SOAJ injection Inject 0.3 mg into the muscle as needed for anaphylaxis. 03/18/24   Rothfuss, Jacob T, [name]  glucose blood (ACCU-CHEK GUIDE TEST) test strip Use to check glucose 3 times daily as instructed 01/18/24   Rothfuss, Jacob T, [name]  ipratropium-albuterol  (DUONEB) 0.5-2.5 (3) MG/3ML SOLN Take 3 mLs by nebulization every 6 (six) hours. 06/20/24   Rothfuss, Jacob T, [name]  meclizine  (ANTIVERT ) 25 MG tablet Take 1 tablet (25 mg total) by mouth 3 (three) times daily as needed for dizziness. 06/01/24   Raspet, Erin K, [name]  omeprazole (PRILOSEC) 20 MG capsule Take 20 mg by mouth daily.    [provider]  OXYGEN  2 L by Does not apply route  at bedtime. Patient taking differently: 2 L by Does not apply route as needed.    [provider]  Tiotropium Bromide -Olodaterol (STIOLTO RESPIMAT ) 2.5-2.5 MCG/ACT AERS Inhale 2 puffs into the lungs daily. 06/16/24     tiZANidine  (ZANAFLEX ) 4 MG tablet Take 1 tablet (4 mg total) by mouth every 6 (six) hours as needed for muscle spasms. 07/04/24   Rothfuss, Lang DASEN, [name]    Family History Family History  Problem Relation Age of Onset   Dementia Mother    Parkinson's disease Mother    Cirrhosis Father    Osteoarthritis Sister    Breast cancer Sister    COPD Sister    COPD Brother     Social History Social History[1]   Allergies   Amoxicillin -pot clavulanate,  Azithromycin , Doxycycline , Levofloxacin, Ceftriaxone , Acetaminophen, Clavulanic acid, Nitrofurantoin, Oxycodone-acetaminophen, Prednisone , Tape, Trimethoprim, and Sulfamethoxazole-trimethoprim   Review of Systems Review of Systems  Constitutional:  Negative for chills and fever.  HENT:  Negative for ear pain and sore throat.   Eyes:  Negative for pain and visual disturbance.  Respiratory:  Positive for cough, chest tightness, shortness of breath and wheezing.   Cardiovascular:  Positive for chest pain (Chest wall pain with deep respiration or cough). Negative for palpitations.  Gastrointestinal:  Negative for abdominal pain, constipation, diarrhea, nausea and vomiting.  Genitourinary:  Negative for dysuria and hematuria.  Musculoskeletal:  Negative for arthralgias and back pain.  Skin:  Negative for color change and rash.  Neurological:  Positive for headaches. Negative for seizures and syncope.  All other systems reviewed and are negative.    Physical Exam Triage Vital Signs ED Triage Vitals  Encounter Vitals Group     BP 09/03/24 0945 (!) 146/83     Girls Systolic BP Percentile --      Girls Diastolic BP Percentile --      Boys Systolic BP Percentile --      Boys Diastolic BP Percentile --      Pulse  Rate 09/03/24 0945 100     Resp 09/03/24 0945 20     Temp 09/03/24 0945 98.5 F (36.9 C)     Temp Source 09/03/24 0945 Oral     SpO2 09/03/24 0945 93 %     Weight --      Height --      Head Circumference --      Peak Flow --      Pain Score 09/03/24 0944 8     Pain Loc --      Pain Education --      Exclude from Growth Chart --    No data found.  Updated Vital Signs BP (!) 146/83 (BP Location: Right Arm)   Pulse 100   Temp 98.5 F (36.9 C) (Oral)   Resp 20   LMP  (LMP Unknown)   SpO2 93%   Visual Acuity Right Eye Distance:   Left Eye Distance:   Bilateral Distance:    Right Eye Near:   Left Eye Near:    Bilateral Near:     Physical Exam Vitals and nursing note reviewed.  Constitutional:      General: She is not in acute distress.    Appearance: She is well-developed. She is ill-appearing. She is not toxic-appearing or diaphoretic.  HENT:     Head: Normocephalic and atraumatic.     Right Ear: Hearing, tympanic membrane, ear canal and external ear normal.     Left Ear: Hearing, tympanic membrane, ear canal and external ear normal.     Nose: Congestion and rhinorrhea present. Rhinorrhea is clear.     Right Sinus: No maxillary sinus tenderness or frontal sinus tenderness.     Left Sinus: No maxillary sinus tenderness or frontal sinus tenderness.     Mouth/Throat:     Lips: Pink.     Mouth: Mucous membranes are moist.     Pharynx: Uvula midline. No oropharyngeal exudate or posterior oropharyngeal erythema.     Tonsils: No tonsillar exudate.  Eyes:     Conjunctiva/sclera: Conjunctivae normal.     Pupils: Pupils are equal, round, and reactive to light.  Cardiovascular:     Rate and Rhythm: Normal rate and regular rhythm.     Heart sounds: S1 normal and S2 normal. No  murmur heard. Pulmonary:     Effort: Pulmonary effort is normal. No respiratory distress.     Breath sounds: Examination of the right-upper field reveals wheezing and rhonchi. Examination of the  left-upper field reveals wheezing and rhonchi. Examination of the right-middle field reveals wheezing. Examination of the left-middle field reveals wheezing. Examination of the right-lower field reveals wheezing. Examination of the left-lower field reveals wheezing. Wheezing (Inspiratory wheezes throughout) and rhonchi (Mild intermittent) present. No decreased breath sounds or rales.     Comments: Oxygen  saturation is 93% on room air.  Patient has inspiratory wheezes throughout.  She has mild, intermittent rhonchi in the upper lobes only. Abdominal:     General: Bowel sounds are normal.     Palpations: Abdomen is soft.     Tenderness: There is no abdominal tenderness.  Musculoskeletal:        General: No swelling.     Cervical back: Neck supple.  Lymphadenopathy:     Head:     Right side of head: No submental, submandibular, tonsillar, preauricular or posterior auricular adenopathy.     Left side of head: No submental, submandibular, tonsillar, preauricular or posterior auricular adenopathy.     Cervical: No cervical adenopathy.     Right cervical: No superficial cervical adenopathy.    Left cervical: No superficial cervical adenopathy.  Skin:    General: Skin is warm and dry.     Capillary Refill: Capillary refill takes less than 2 seconds.     Findings: No rash.  Neurological:     Mental Status: She is alert and oriented to person, place, and time.  Psychiatric:        Mood and Affect: Mood normal.      UC Treatments / Results  Labs (all labs ordered are listed, but only abnormal results are displayed) Labs Reviewed - No data to display  EKG   Radiology No results found.  Procedures Procedures (including critical care time)  Medications Ordered in UC Medications - No data to display  Initial Impression / Assessment and Plan / UC Course  I have reviewed the triage vital signs and the nursing notes.  Pertinent labs & imaging results that were available during my care  of the patient were reviewed by me and considered in my medical decision making (see chart for details).  Plan of Care (see discharge instructions for additional patient precautions and education): COPD exacerbation with bronchitis versus community-acquired pneumonia: X-ray done on 08/30/2024 was negative for pneumonia but did show some hazy changes.  Patient has been treated with inhalers and Kenalog  80 mg.  She still wheezing throughout with some rhonchi.  She declined a chest x-ray today.  Cefdinir  300 mg twice daily for 10 days.  Continue inhalers as previously prescribed.  Patient has DuoNeb nebulizer for use every 4-6 hours at home if needed.  Patient uses oxygen  by nasal cannula at night or as needed.  Reviewed signs and symptoms of worsening condition and reasons to call 911 and go to the emergency room (see discharge instructions).  Instructed that if she loses power and her oxygen  concentrator will not work, she needs to either go somewhere where she can have power or call 911 for help.  Instructed to call her pulmonologist and get an appointment for further evaluation in the next 1 to 3 weeks.  I reviewed the plan of care with the patient and/or the patient's guardian.  The patient and/or guardian had time to ask questions and acknowledged that the  questions were answered.  Final Clinical Impressions(s) / UC Diagnoses   Final diagnoses:  COPD exacerbation (HCC)  Community acquired pneumonia, unspecified laterality     Discharge Instructions      COPD exacerbation with bronchitis versus community-acquired pneumonia: X-ray done on 08/30/2024 was negative for pneumonia but did show some hazy changes.  Patient has been treated with inhalers and Kenalog  80 mg.  She still wheezing throughout with some rhonchi.  She declined a chest x-ray today.  Cefdinir  300 mg twice daily for 10 days.  Continue inhalers as previously prescribed.  Patient has DuoNeb nebulizer for use every 4-6 hours at home if  needed.  Patient uses oxygen  by nasal cannula at night or as needed.  Reviewed signs and symptoms of worsening condition and reasons to call 911 and go to the emergency room.  Instructed to call her pulmonologist and get an appointment for further evaluation in the next 1 to 3 weeks.  Get help right away if: Your shortness of breath becomes worse. Your chest pain increases. Your sickness becomes worse, especially if you are an older adult or have a weak immune system. You cough up blood. These symptoms may be an emergency. Get help right away. Call 911. Do not wait to see if the symptoms will go away. Do not drive yourself to the hospital.     ED Prescriptions     Medication Sig Dispense Auth. Provider   cefdinir  (OMNICEF ) 300 MG capsule Take 1 capsule (300 mg total) by mouth 2 (two) times daily for 10 days. 20 capsule Ival Domino, FNP      PDMP not reviewed this encounter.    [1]  Social History Tobacco Use   Smoking status: Every Day    Current packs/day: 0.50    Average packs/day: 0.5 packs/day for 58.1 years (29.0 ttl pk-yrs)    Types: Cigarettes    Start date: 1968    Passive exposure: Current   Smokeless tobacco: Never  Vaping Use   Vaping status: Never Used  Substance Use Topics   Alcohol use: Not Currently   Drug use: Never     Ival Domino, FNP 09/03/24 1028  "

## 2024-09-03 NOTE — Discharge Instructions (Addendum)
 COPD exacerbation with bronchitis versus community-acquired pneumonia: X-ray done on 08/30/2024 was negative for pneumonia but did show some hazy changes.  Patient has been treated with inhalers and Kenalog  80 mg.  She still wheezing throughout with some rhonchi.  She declined a chest x-ray today.  Cefdinir  300 mg twice daily for 10 days.  Continue inhalers as previously prescribed.  Patient has DuoNeb nebulizer for use every 4-6 hours at home if needed.  Patient uses oxygen  by nasal cannula at night or as needed.  Reviewed signs and symptoms of worsening condition and reasons to call 911 and go to the emergency room.  Instructed to call her pulmonologist and get an appointment for further evaluation in the next 1 to 3 weeks.  Get help right away if: Your shortness of breath becomes worse. Your chest pain increases. Your sickness becomes worse, especially if you are an older adult or have a weak immune system. You cough up blood. These symptoms may be an emergency. Get help right away. Call 911. Do not wait to see if the symptoms will go away. Do not drive yourself to the hospital.

## 2024-09-03 NOTE — ED Triage Notes (Signed)
 Pt reports she is not feeling any better since her visit on 01/20 she has a headache, chest/rib pain, shortness of breath she has done her inhalers. Pt reports at last visit she didn't know what the antibiotic was that helped her before but she now reports its cefdinir .

## 2024-09-08 ENCOUNTER — Ambulatory Visit: Payer: Self-pay

## 2024-09-08 NOTE — Telephone Encounter (Signed)
" °  FYI Only or Action Required?: Action required by provider: request for appointment.  Patient was last seen in primary care on 08/17/2024 by Rothfuss, Lang DASEN, [name].  Called Nurse Triage reporting Chest Pain.  Symptoms began yesterday.  Interventions attempted: OTC medications: Dayquil, Nyquil, Theraflu.  Symptoms are: gradually worsening.  Triage Disposition: See Physician Within 24 Hours  Patient/caregiver understands and will follow disposition?: Yes  Message from Hadassah PARAS sent at 09/08/2024 12:47 PM EST  Reason for Triage: Pt states she had a bad cough yesterday, pt is recovering from the bronchitis and flu, tickling in throat, bad cough, soreness and feels like there is something under right breast. Umbearable pain when coughing. Transferring to NT   Reason for Disposition  [1] Chest pain lasts > 5 minutes AND [2] occurred > 3 days ago (72 hours) AND [3] NO chest pain or cardiac symptoms now  Answer Assessment - Initial Assessment Questions 1. LOCATION: Where does it hurt?       Under right breast  2. RADIATION: Does the pain go anywhere else? (e.g., into neck, jaw, arms, back)      Denies  3. ONSET: When did the chest pain begin? (Minutes, hours or days)      Last night  4. PATTERN: Does the pain come and go, or has it been constant since it started?  Does it get worse with exertion?      Come and goes  5. DURATION: How long does it last (e.g., seconds, minutes, hours)     Only when coughing  6. SEVERITY: How bad is the pain?  (e.g., Scale 1-10; mild, moderate, or severe)      10/10  7. CARDIAC RISK FACTORS: Do you have any history of heart problems or risk factors for heart disease? (e.g., angina, prior heart attack; diabetes, high blood pressure, high cholesterol, smoker, or strong family history of heart disease)      HTN  8. PULMONARY RISK FACTORS: Do you have any history of lung disease?  (e.g., blood clots in lung, asthma, emphysema, birth  control pills)     Resp failure and COPD  9. CAUSE: What do you think is causing the chest pain?     Pt states she may have pulled a muscle coughing  10. OTHER SYMPTOMS: Pt denies dizziness, nausea, vomiting, sweating, fever, difficulty breathing          Pt reports chest pain and cough Pt is taking OTC Theraflu, Dayquil, Nyquil Earliest available appt is 02.03.26.  Routing to clinic to assist with possibly scheduling a sooner appt if there is availability. Pt agrees with plan of care, will call back for any worsening symptoms  Protocols used: Chest Pain-A-AH  "

## 2024-09-09 ENCOUNTER — Ambulatory Visit (INDEPENDENT_AMBULATORY_CARE_PROVIDER_SITE_OTHER): Admitting: Family Medicine

## 2024-09-09 ENCOUNTER — Encounter (HOSPITAL_BASED_OUTPATIENT_CLINIC_OR_DEPARTMENT_OTHER): Payer: Self-pay | Admitting: Family Medicine

## 2024-09-09 ENCOUNTER — Other Ambulatory Visit (HOSPITAL_BASED_OUTPATIENT_CLINIC_OR_DEPARTMENT_OTHER): Payer: Self-pay

## 2024-09-09 ENCOUNTER — Ambulatory Visit (INDEPENDENT_AMBULATORY_CARE_PROVIDER_SITE_OTHER)
Admission: RE | Admit: 2024-09-09 | Discharge: 2024-09-09 | Disposition: A | Source: Ambulatory Visit | Attending: Family Medicine | Admitting: Family Medicine

## 2024-09-09 VITALS — BP 131/76 | HR 95 | Temp 98.2°F | Resp 16 | Ht 63.5 in | Wt 183.6 lb

## 2024-09-09 DIAGNOSIS — Z720 Tobacco use: Secondary | ICD-10-CM

## 2024-09-09 DIAGNOSIS — J189 Pneumonia, unspecified organism: Secondary | ICD-10-CM

## 2024-09-09 DIAGNOSIS — J441 Chronic obstructive pulmonary disease with (acute) exacerbation: Secondary | ICD-10-CM

## 2024-09-09 MED ORDER — PREDNISONE 20 MG PO TABS
40.0000 mg | ORAL_TABLET | Freq: Every day | ORAL | 0 refills | Status: DC
  Filled 2024-09-09: qty 8, 4d supply, fill #0

## 2024-09-09 NOTE — Progress Notes (Signed)
 "  Established Patient Office Visit  Subjective   Patient ID: Kaitlyn Rosales, female    DOB: 08/23/1955  Age: 69 y.o. MRN: [id number]  Chief Complaint  Patient presents with   Cough    Cough. Was told she had bronchitis on 09/03/2024. Went to [facility] ED Saturday and was told she had the flu and bronchitis. Was given tamiflu. Has been coughing so deep.    Discussed the use of AI scribe software for clinical note transcription with the patient, who gave verbal consent to proceed.  History of Present Illness Kaitlyn Rosales is a 69 year old female with recurring COPD exacerbation who presents with persistent cough and congestion.  She has been experiencing persistent cough and congestion for the past week, following a diagnosis of the flu in the emergency room. She was treated with Tamiflu, but her symptoms have not improved. She has been on antibiotics and received a Kenalog  shot, yet she continues to have significant congestion and difficulty maintaining adequate oxygen  levels.  She has a history of recurrent pneumonia, including a severe episode of double pneumonia two years ago, and has had multiple hospitalizations throughout her life. A recent chest x-ray did not show pneumonia, but the patient reports being told she was 'rattling' and was started on antibiotics.  She experiences chest pain when coughing, which she attributes to the intensity of her cough. She is a smoker, currently smoking less than usual due to her illness, and is attempting to cut down further. She uses a DuoNeb machine every four hours.  She has multiple drug allergies, including reactions to some antibiotics, which complicates her treatment options. She has an EpiPen  for allergic reactions but has not needed to use it recently. 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  She is under the care of a lung doctor and a  gastroenterologist for her esophageal issues. She has not taken prednisone  recently due to concerns about past reactions, although she is unsure if prednisone  was the cause of her symptoms.  Normally sees [facility], [place].    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 Added automatically from request for surgery [phone]   Chronic respiratory failure with hypoxia (HCC) 01/09/2021   Gastroesophageal reflux disease without esophagitis 05/05/2017   History of MI (myocardial infarction) 03/07/2021   Formatting of this note might be different from the original. living in [place], never cathed (early 40s ~) patient reports MD said light heart attack   Mixed hyperlipidemia 10/29/2017   OSA (obstructive sleep apnea) 01/09/2021   Posttraumatic stress disorder 10/15/2016    Outpatient Encounter Medications as of 09/09/2024  Medication Sig   Accu-Chek Softclix Lancets lancets Use as directed to check blood sugar three times daily.   acetaminophen (TYLENOL) 325 MG tablet Take 325 mg by mouth as needed (prn).   albuterol  (VENTOLIN  HFA) 108 (90 Base) MCG/ACT inhaler Inhale 2 puffs into the lungs every 6 (six) hours as needed.   atorvastatin  (LIPITOR) 40 MG tablet Take 1 tablet (40 mg total) by mouth daily for cholesterol   Blood Glucose Monitoring Suppl (ACCU-CHEK GUIDE ME) w/Device KIT by Does not apply route.   busPIRone  (  BUSPAR ) 7.5 MG tablet Take 0.5 tablets by mouth 2 (two) times daily.   cefdinir  (OMNICEF ) 300 MG capsule Take 1 capsule (300 mg total) by mouth 2 (two) times daily for 10 days.   cetirizine  (ZYRTEC ) 10 MG tablet Take 1 tablet (10 mg total) by mouth daily. (Patient taking differently: Take 10 mg by mouth as needed.)   Cholecalciferol (VITAMIN D3)  250 MCG (10000 UT) TABS Take 10,000 Units by mouth daily.   cyanocobalamin  (VITAMIN B12) 1000 MCG tablet Take 1,000 mcg by mouth daily.   diazepam  (VALIUM ) 5 MG tablet Take 1 tablet (5 mg total) by mouth every 12 (twelve) hours as needed for anxiety.   EPINEPHrine  (EPIPEN  2-PAK) 0.3 mg/0.3 mL IJ SOAJ injection Inject 0.3 mg into the muscle as needed for anaphylaxis.   glucose blood (ACCU-CHEK GUIDE TEST) test strip Use to check glucose 3 times daily as instructed   ipratropium-albuterol  (DUONEB) 0.5-2.5 (3) MG/3ML SOLN Take 3 mLs by nebulization every 6 (six) hours.   meclizine  (ANTIVERT ) 25 MG tablet Take 1 tablet (25 mg total) by mouth 3 (three) times daily as needed for dizziness.   omeprazole (PRILOSEC) 20 MG capsule Take 20 mg by mouth daily.   ondansetron  (ZOFRAN -ODT) 4 MG disintegrating tablet Take 4 mg by mouth every 8 (eight) hours as needed for nausea or vomiting.   OXYGEN  2 L by Does not apply route at bedtime. (Patient taking differently: 2 L by Does not apply route as needed.)   predniSONE  (DELTASONE ) 20 MG tablet Take 2 tablets (40 mg total) by mouth daily with breakfast.   Tiotropium Bromide -Olodaterol (STIOLTO RESPIMAT ) 2.5-2.5 MCG/ACT AERS Inhale 2 puffs into the lungs daily.   tiZANidine  (ZANAFLEX ) 4 MG tablet Take 1 tablet (4 mg total) by mouth every 6 (six) hours as needed for muscle spasms.   No facility-administered encounter medications on file as of 09/09/2024.    Social History[1]    Review of Systems  Constitutional:  Positive for malaise/fatigue. Negative for diaphoresis, fever and weight loss.  Respiratory:  Positive for cough, shortness of breath and wheezing.   Cardiovascular:  Negative for chest pain, palpitations, orthopnea, claudication, leg swelling and PND.      Objective:     BP 131/76   Pulse 95   Temp 98.2 F (36.8 C) (Oral)   Resp 16   Ht 5' 3.5 (1.613 m)   Wt 183 lb 9.6 oz (83.3 kg)   LMP  (LMP Unknown)   SpO2 92%   BMI 32.01 kg/m     Physical Exam Constitutional:      General: She is not in acute distress.    Appearance: She is ill-appearing. She is not toxic-appearing.     Comments: Nontoxic.  Nondyspneic at rest.  HENT:     Head:     Comments: Only mild sinus tenderness and congestion    Right Ear: Tympanic membrane normal.     Left Ear: Tympanic membrane normal.     Nose: No congestion or rhinorrhea.     Right Sinus: Maxillary sinus tenderness present.     Left Sinus: Maxillary sinus tenderness present.     Mouth/Throat:     Pharynx: Oropharynx is clear. Uvula swelling and postnasal drip present. No oropharyngeal exudate or posterior oropharyngeal erythema.     Tonsils: No tonsillar abscesses.  Eyes:     Conjunctiva/sclera: Conjunctivae normal.  Cardiovascular:     Rate and Rhythm: Normal rate and regular rhythm.     Heart sounds: Normal  heart sounds.  Pulmonary:     Effort: Pulmonary effort is normal.     Breath sounds: Normal breath sounds.  Abdominal:     Palpations: Abdomen is soft.     Tenderness: There is no abdominal tenderness.  Musculoskeletal:     Cervical back: Normal range of motion and neck supple. No rigidity.     Right lower leg: No edema.     Left lower leg: No edema.  Lymphadenopathy:     Cervical: No cervical adenopathy.  Neurological:     Mental Status: She is alert.      No results found for any visits on 09/09/24.    The ASCVD Risk score (Arnett DK, et al., 2019) failed to calculate for the following reasons:   Risk score cannot be calculated because patient has a medical history suggesting prior/existing ASCVD   * - Cholesterol units were assumed    Assessment & Plan:   Assessment & Plan COPD with acute exacerbation (HCC) Acute exacerbation of COPD with persistent cough and chest discomfort. Recent ER visit with Tamiflu and antibiotics. Current symptoms include coughing and chest pain, likely due to muscle strain from coughing. Multiple drug allergies limit  treatment options. Recent use of DuoNeb every four hours as per ER instructions. Prednisone  previously caused side effects, but I'm not convinced they were serious.   - Ordered repeat chest x-ray to rule out pneumonia - Continue DuoNeb every four hours as needed - Urged follow-up with pulmonologist next week if possible. - Finish Cefdinir  and watch carefully as unable to cover with a Quinolone, etc. - Suggested she consider Allergist referral to see if they can further assist with her multitude of medication allergies. Orders:   DG Chest 2 View   predniSONE  (DELTASONE ) 20 MG tablet; Take 2 tablets (40 mg total) by mouth daily with breakfast.  Tobacco abuse Current smoker with reduced smoking to half a pack per day when not sick. Acknowledges difficulty in quitting but is reportedly making progress. Smoking cessation is crucial for COPD management. - Encouraged continued reduction in smoking - Discussed risks of smoking and benefits of cessation        Return if symptoms worsen or fail to improve.    REDDING PONCE NORLEEN FALCON., MD     [1]  Social History Tobacco Use   Smoking status: Every Day    Current packs/day: 0.50    Average packs/day: 0.5 packs/day for 58.1 years (29.0 ttl pk-yrs)    Types: Cigarettes    Start date: 1968    Passive exposure: Current   Smokeless tobacco: Never  Vaping Use   Vaping status: Never Used  Substance Use Topics   Alcohol use: Not Currently   Drug use: Never   "

## 2024-09-09 NOTE — Assessment & Plan Note (Addendum)
 Acute exacerbation of COPD with persistent cough and chest discomfort. Recent ER visit with Tamiflu and antibiotics. Current symptoms include coughing and chest pain, likely due to muscle strain from coughing. Multiple drug allergies limit treatment options. Recent use of DuoNeb every four hours as per ER instructions. Prednisone  previously caused side effects, but I'm not convinced they were serious.   - Ordered repeat chest x-ray to rule out pneumonia - Continue DuoNeb every four hours as needed - Urged follow-up with pulmonologist next week if possible. - Finish Cefdinir  and watch carefully as unable to cover with a Quinolone, etc. - Suggested she consider Allergist referral to see if they can further assist with her multitude of medication allergies. Orders:   DG Chest 2 View   predniSONE  (DELTASONE ) 20 MG tablet; Take 2 tablets (40 mg total) by mouth daily with breakfast.

## 2024-09-09 NOTE — Assessment & Plan Note (Addendum)
 Current smoker with reduced smoking to half a pack per day when not sick. Acknowledges difficulty in quitting but is reportedly making progress. Smoking cessation is crucial for COPD management. - Encouraged continued reduction in smoking - Discussed risks of smoking and benefits of cessation

## 2024-09-12 ENCOUNTER — Inpatient Hospital Stay (HOSPITAL_BASED_OUTPATIENT_CLINIC_OR_DEPARTMENT_OTHER): Admission: RE | Admit: 2024-09-12 | Discharge: 2024-09-12

## 2024-09-12 ENCOUNTER — Other Ambulatory Visit (HOSPITAL_BASED_OUTPATIENT_CLINIC_OR_DEPARTMENT_OTHER): Payer: Self-pay

## 2024-09-12 ENCOUNTER — Encounter (HOSPITAL_BASED_OUTPATIENT_CLINIC_OR_DEPARTMENT_OTHER): Payer: Self-pay

## 2024-09-12 VITALS — BP 120/83 | HR 96 | Temp 98.7°F | Resp 19

## 2024-09-12 DIAGNOSIS — J441 Chronic obstructive pulmonary disease with (acute) exacerbation: Secondary | ICD-10-CM | POA: Diagnosis not present

## 2024-09-12 DIAGNOSIS — R0602 Shortness of breath: Secondary | ICD-10-CM

## 2024-09-12 DIAGNOSIS — F411 Generalized anxiety disorder: Secondary | ICD-10-CM | POA: Diagnosis not present

## 2024-09-12 DIAGNOSIS — K219 Gastro-esophageal reflux disease without esophagitis: Secondary | ICD-10-CM | POA: Diagnosis not present

## 2024-09-12 MED ORDER — PREDNISOLONE SODIUM PHOSPHATE 15 MG/5ML PO SOLN
ORAL | 0 refills | Status: AC
  Filled 2024-09-12: qty 70, 6d supply, fill #0

## 2024-09-12 MED ORDER — LANSOPRAZOLE 30 MG PO CPDR
30.0000 mg | DELAYED_RELEASE_CAPSULE | Freq: Every day | ORAL | 0 refills | Status: AC
  Filled 2024-09-12: qty 30, 30d supply, fill #0

## 2024-09-12 NOTE — Discharge Instructions (Addendum)
 COPD exacerbation with shortness of breath and anxiety associated with the shortness of breath: Chest x-ray done on 09/09/2024 was negative.  Chest x-ray not done today.  Oxygen  saturation is 91% on room air.  Patient had a nebulizer treatment immediately prior to leaving home.  She does have home oxygen  to use as needed.  And she has a pulse oximeter at home.  She will use oxygen  to maintain an oxygen  saturation of 90% or higher.  Continue albuterol  inhaler, 2 puffs, every 4 hours with spacer if needed for wheezing and shortness of breath.  Continue Stiolto Respimat , 2 puffs once daily.  Prednisolone15 ml (45 mg) daily for 3 days then take 7.5 ml (22.5 mg) daily for 3 days.  Take in the morning and take after eating.  Get plenty of fluids and rest.  GERD: Added lansoprazole  30 mg once daily for 30 days to reduce any GERD symptoms with use of prednisolone .  Follow-up with primary care or pulmonology as needed.  Return here if needed.

## 2024-09-12 NOTE — ED Triage Notes (Signed)
 Patient here today with c/o SOB, cough, and wheeze X 2 weeks. Patient was here on 01/24 and states that she has not had any improvement. Patient states that she is feeling anxious because she cannot get a good breath.

## 2024-09-13 ENCOUNTER — Other Ambulatory Visit (HOSPITAL_BASED_OUTPATIENT_CLINIC_OR_DEPARTMENT_OTHER): Payer: Self-pay | Admitting: Student

## 2024-09-13 DIAGNOSIS — F410 Panic disorder [episodic paroxysmal anxiety] without agoraphobia: Secondary | ICD-10-CM

## 2024-09-14 ENCOUNTER — Other Ambulatory Visit (HOSPITAL_BASED_OUTPATIENT_CLINIC_OR_DEPARTMENT_OTHER): Payer: Self-pay | Admitting: Family Medicine

## 2024-09-14 ENCOUNTER — Other Ambulatory Visit (HOSPITAL_BASED_OUTPATIENT_CLINIC_OR_DEPARTMENT_OTHER): Payer: Self-pay

## 2024-09-14 DIAGNOSIS — J410 Simple chronic bronchitis: Secondary | ICD-10-CM

## 2024-09-14 DIAGNOSIS — F410 Panic disorder [episodic paroxysmal anxiety] without agoraphobia: Secondary | ICD-10-CM

## 2024-09-14 MED ORDER — IPRATROPIUM-ALBUTEROL 0.5-2.5 (3) MG/3ML IN SOLN
3.0000 mL | Freq: Four times a day (QID) | RESPIRATORY_TRACT | 3 refills | Status: AC
  Filled 2024-09-14: qty 90, 8d supply, fill #0

## 2024-09-14 MED ORDER — DIAZEPAM 5 MG PO TABS
5.0000 mg | ORAL_TABLET | Freq: Two times a day (BID) | ORAL | 0 refills | Status: AC | PRN
  Filled 2024-09-14: qty 30, 15d supply, fill #0

## 2024-10-18 ENCOUNTER — Ambulatory Visit (HOSPITAL_BASED_OUTPATIENT_CLINIC_OR_DEPARTMENT_OTHER): Admitting: Student

## 2024-12-02 ENCOUNTER — Ambulatory Visit: Admitting: Podiatry
